# Patient Record
Sex: Female | Born: 1985
Health system: Southern US, Community
[De-identification: ages and names within clinical notes are randomized; demographics above are authoritative.]

## PROBLEM LIST (undated history)

## (undated) ENCOUNTER — Inpatient Hospital Stay (HOSPITAL_COMMUNITY): Payer: Self-pay

## (undated) DIAGNOSIS — F419 Anxiety disorder, unspecified: Secondary | ICD-10-CM

## (undated) DIAGNOSIS — J309 Allergic rhinitis, unspecified: Secondary | ICD-10-CM

## (undated) DIAGNOSIS — T7840XA Allergy, unspecified, initial encounter: Secondary | ICD-10-CM

## (undated) DIAGNOSIS — G43909 Migraine, unspecified, not intractable, without status migrainosus: Secondary | ICD-10-CM

## (undated) HISTORY — DX: Anxiety disorder, unspecified: F41.9

## (undated) HISTORY — PX: LIPOMA EXCISION: SHX5283

## (undated) HISTORY — DX: Migraine, unspecified, not intractable, without status migrainosus: G43.909

## (undated) HISTORY — DX: Allergy, unspecified, initial encounter: T78.40XA

## (undated) HISTORY — DX: Allergic rhinitis, unspecified: J30.9

---

## 2007-02-12 ENCOUNTER — Other Ambulatory Visit: Admission: RE | Admit: 2007-02-12 | Discharge: 2007-02-12 | Payer: Self-pay | Admitting: Obstetrics and Gynecology

## 2008-02-13 ENCOUNTER — Other Ambulatory Visit: Admission: RE | Admit: 2008-02-13 | Discharge: 2008-02-13 | Payer: Self-pay | Admitting: Obstetrics and Gynecology

## 2009-03-04 ENCOUNTER — Other Ambulatory Visit: Admission: RE | Admit: 2009-03-04 | Discharge: 2009-03-04 | Payer: Self-pay | Admitting: Obstetrics and Gynecology

## 2010-05-04 ENCOUNTER — Other Ambulatory Visit
Admission: RE | Admit: 2010-05-04 | Discharge: 2010-05-04 | Payer: Self-pay | Source: Home / Self Care | Admitting: Obstetrics and Gynecology

## 2012-01-09 ENCOUNTER — Other Ambulatory Visit (HOSPITAL_COMMUNITY)
Admission: RE | Admit: 2012-01-09 | Discharge: 2012-01-09 | Disposition: A | Discharge status: Home / Self Care | Payer: Commercial Indemnity | Source: Ambulatory Visit | Attending: Obstetrics and Gynecology | Admitting: Obstetrics and Gynecology

## 2012-01-09 DIAGNOSIS — Z01419 Encounter for gynecological examination (general) (routine) without abnormal findings: Secondary | ICD-10-CM | POA: Insufficient documentation

## 2014-02-24 ENCOUNTER — Other Ambulatory Visit (HOSPITAL_COMMUNITY)
Admission: RE | Admit: 2014-02-24 | Discharge: 2014-02-24 | Disposition: A | Discharge status: Home / Self Care | Payer: BLUE CROSS/BLUE SHIELD | Source: Ambulatory Visit | Attending: Obstetrics and Gynecology | Admitting: Obstetrics and Gynecology

## 2014-02-24 ENCOUNTER — Other Ambulatory Visit: Payer: Self-pay | Admitting: Nurse Practitioner

## 2014-02-24 DIAGNOSIS — Z01419 Encounter for gynecological examination (general) (routine) without abnormal findings: Secondary | ICD-10-CM | POA: Diagnosis not present

## 2014-02-25 LAB — CYTOLOGY - PAP

## 2014-04-13 ENCOUNTER — Other Ambulatory Visit: Payer: Self-pay | Admitting: Obstetrics and Gynecology

## 2014-04-13 DIAGNOSIS — N644 Mastodynia: Secondary | ICD-10-CM

## 2014-04-15 ENCOUNTER — Ambulatory Visit
Admission: RE | Admit: 2014-04-15 | Discharge: 2014-04-15 | Disposition: A | Discharge status: Home / Self Care | Payer: Commercial Indemnity | Source: Ambulatory Visit | Attending: Obstetrics and Gynecology | Admitting: Obstetrics and Gynecology

## 2014-04-15 DIAGNOSIS — N644 Mastodynia: Secondary | ICD-10-CM

## 2015-04-25 NOTE — L&D Delivery Note (Signed)
Delivery Note At 10:22 AM a viable female was delivered via Vaginal, Spontaneous Delivery (Presentation: direct OA;  ).  APGAR: 8, 9; weight pending  .   Placenta status: Intact,Spontaneous .  Cord:  with the following complications: .  Cord pH: n/a. Copious meconium stained fluid noted after baby delivered.  Anesthesia:  Epidural, Less than 10 ml Lidocaine Episiotomy: None Lacerations: 2nd degree-repaired;Perineal;Labial-hemostatic, not repaired Suture Repair: 2.0 3.0 vicryl Est. Blood Loss (mL):  200  Mom to postpartum.  Baby to Couplet care / Skin to Skin.  Diane Cross 03/07/2016, 11:08 AM

## 2015-07-12 ENCOUNTER — Emergency Department (HOSPITAL_BASED_OUTPATIENT_CLINIC_OR_DEPARTMENT_OTHER)
Admission: EM | Admit: 2015-07-12 | Discharge: 2015-07-12 | Disposition: A | Discharge status: Home / Self Care | Payer: BLUE CROSS/BLUE SHIELD | Attending: Emergency Medicine | Admitting: Emergency Medicine

## 2015-07-12 ENCOUNTER — Encounter (HOSPITAL_BASED_OUTPATIENT_CLINIC_OR_DEPARTMENT_OTHER): Payer: Self-pay | Admitting: *Deleted

## 2015-07-12 DIAGNOSIS — J069 Acute upper respiratory infection, unspecified: Secondary | ICD-10-CM | POA: Diagnosis not present

## 2015-07-12 DIAGNOSIS — M791 Myalgia: Secondary | ICD-10-CM | POA: Insufficient documentation

## 2015-07-12 DIAGNOSIS — J029 Acute pharyngitis, unspecified: Secondary | ICD-10-CM | POA: Diagnosis present

## 2015-07-12 DIAGNOSIS — J3489 Other specified disorders of nose and nasal sinuses: Secondary | ICD-10-CM

## 2015-07-12 LAB — RAPID STREP SCREEN (MED CTR MEBANE ONLY): Streptococcus, Group A Screen (Direct): NEGATIVE

## 2015-07-12 MED ORDER — ACETAMINOPHEN 500 MG PO TABS
1000.0000 mg | ORAL_TABLET | Freq: Once | ORAL | Status: AC
  Administered 2015-07-12: 1000 mg via ORAL
  Filled 2015-07-12: qty 2

## 2015-07-12 MED ORDER — OXYMETAZOLINE HCL 0.05 % NA SOLN
2.0000 | Freq: Once | NASAL | Status: AC
  Administered 2015-07-12: 2 via NASAL
  Filled 2015-07-12: qty 15

## 2015-07-12 NOTE — Discharge Instructions (Signed)
Upper Respiratory Infection, Adult Most upper respiratory infections (URIs) are a viral infection of the air passages leading to the lungs. A URI affects the nose, throat, and upper air passages. The most common type of URI is nasopharyngitis and is typically referred to as "the common cold." URIs run their course and usually go away on their own. Most of the time, a URI does not require medical attention, but sometimes a bacterial infection in the upper airways can follow a viral infection. This is called a secondary infection. Sinus and middle ear infections are common types of secondary upper respiratory infections. Bacterial pneumonia can also complicate a URI. A URI can worsen asthma and chronic obstructive pulmonary disease (COPD). Sometimes, these complications can require emergency medical care and may be life threatening.  CAUSES Almost all URIs are caused by viruses. A virus is a type of germ and can spread from one person to another.  RISKS FACTORS You may be at risk for a URI if:   You smoke.   You have chronic heart or lung disease.  You have a weakened defense (immune) system.   You are very young or very old.   You have nasal allergies or asthma.  You work in crowded or poorly ventilated areas.  You work in health care facilities or schools. SIGNS AND SYMPTOMS  Symptoms typically develop 2-3 days after you come in contact with a cold virus. Most viral URIs last 7-10 days. However, viral URIs from the influenza virus (flu virus) can last 14-18 days and are typically more severe. Symptoms may include:   Runny or stuffy (congested) nose.   Sneezing.   Cough.   Sore throat.   Headache.   Fatigue.   Fever.   Loss of appetite.   Pain in your forehead, behind your eyes, and over your cheekbones (sinus pain).  Muscle aches.  DIAGNOSIS  Your health care provider may diagnose a URI by:  Physical exam.  Tests to check that your symptoms are not due  to another condition such as:  Strep throat.  Sinusitis.  Pneumonia.  Asthma. TREATMENT  A URI goes away on its own with time. It cannot be cured with medicines, but medicines may be prescribed or recommended to relieve symptoms. Medicines may help:  Reduce your fever.  Reduce your cough.  Relieve nasal congestion. HOME CARE INSTRUCTIONS   Take medicines only as directed by your health care provider.   Gargle warm saltwater or take cough drops to comfort your throat as directed by your health care provider.  Use a warm mist humidifier or inhale steam from a shower to increase air moisture. This may make it easier to breathe.  Drink enough fluid to keep your urine clear or pale yellow.   Eat soups and other clear broths and maintain good nutrition.   Rest as needed.   Return to work when your temperature has returned to normal or as your health care provider advises. You may need to stay home longer to avoid infecting others. You can also use a face mask and careful hand washing to prevent spread of the virus.  Increase the usage of your inhaler if you have asthma.   Do not use any tobacco products, including cigarettes, chewing tobacco, or electronic cigarettes. If you need help quitting, ask your health care provider. PREVENTION  The best way to protect yourself from getting a cold is to practice good hygiene.   Avoid oral or hand contact with people with cold  symptoms.   Wash your hands often if contact occurs.  There is no clear evidence that vitamin C, vitamin E, echinacea, or exercise reduces the chance of developing a cold. However, it is always recommended to get plenty of rest, exercise, and practice good nutrition.  SEEK MEDICAL CARE IF:   You are getting worse rather than better.   Your symptoms are not controlled by medicine.   You have chills.  You have worsening shortness of breath.  You have brown or red mucus.  You have yellow or brown  nasal discharge.  You have pain in your face, especially when you bend forward.  You have a fever.  You have swollen neck glands.  You have pain while swallowing.  You have white areas in the back of your throat. SEEK IMMEDIATE MEDICAL CARE IF:   You have severe or persistent:  Headache.  Ear pain.  Sinus pain.  Chest pain.  You have chronic lung disease and any of the following:  Wheezing.  Prolonged cough.  Coughing up blood.  A change in your usual mucus.  You have a stiff neck.  You have changes in your:  Vision.  Hearing.  Thinking.  Mood. MAKE SURE YOU:   Understand these instructions.  Will watch your condition.  Will get help right away if you are not doing well or get worse.   This information is not intended to replace advice given to you by your health care provider. Make sure you discuss any questions you have with your health care provider.   Document Released: 10/04/2000 Document Revised: 08/25/2014 Document Reviewed: 07/16/2013 Elsevier Interactive Patient Education 2016 [employer] Inc[employer] Sinus Headache A sinus headache occurs when the paranasal sinuses become clogged or swollen. Paranasal sinuses are air pockets within the bones of the face. Sinus headaches can range from mild to severe. CAUSES A sinus headache can result from various conditions that affect the sinuses, such as:  Colds.  Sinus infections.  Allergies. SYMPTOMS The main symptom of this condition is a headache that may feel like pain or pressure in the face, forehead, ears, or upper teeth. People who have a sinus headache often have other symptoms, such as:  Congested or runny nose.  Fever.  Inability to smell. Weather changes can make symptoms worse. DIAGNOSIS This condition may be diagnosed based on:  A physical exam and medical history.  Imaging tests, such as a CT scan and MRI, to check for problems with the sinuses.  A specialist may look into the  sinuses with a tool that has a camera (endoscopy). TREATMENT Treatment for this condition depends on the cause.  Sinus pain that is caused by a sinus infection may be treated with antibiotic medicine.  Sinus pain that is caused by allergies may be helped by allergy medicines (antihistamines) and medicated nasal sprays.  Sinus pain that is caused by congestion may be helped by flushing the nose and sinuses with saline solution. HOME CARE INSTRUCTIONS  Take medicines only as directed by your health care provider.  If you were prescribed an antibiotic medicine, finish all of it even if you start to feel better.  If you have congestion, use a nasal spray to help reduce pressure.  If directed, apply a warm, moist washcloth to your face to help relieve pain. SEEK MEDICAL CARE IF:  You have headaches more than one time each week.  You have sensitivity to light or sound.  You have a fever.  You feel sick to your  stomach (nauseous) or you throw up (vomit).  Your headaches do not get better with treatment. Many people think that they have a sinus headache when they actually have migraines or tension headaches. SEEK IMMEDIATE MEDICAL CARE IF:  You have vision problems.  You have sudden, severe pain in your face or head.  You have a seizure.  You are confused.  You have a stiff neck.   This information is not intended to replace advice given to you by your health care provider. Make sure you discuss any questions you have with your health care provider.   Document Released: 05/18/2004 Document Revised: 08/25/2014 Document Reviewed: 04/06/2014 Elsevier Interactive Patient Education [employer]

## 2015-07-12 NOTE — ED Notes (Signed)
Pt reports sore throat, congestion, cough x Friday with subjective temps.

## 2015-07-12 NOTE — ED Provider Notes (Signed)
CSN: [id number]     Arrival date & time 07/12/15  [id number] History   First MD Initiated Contact with Patient 07/12/15 [phone]     Chief Complaint  Patient presents with  . Sore Throat     (Consider location/radiation/quality/duration/timing/severity/associated sxs/prior Treatment) HPI Patient presents with sinus congestion, nasal congestion, nonproductive cough and sore throat for the past 3 days. She also has had diffuse myalgias. States she is [redacted] weeks pregnant. Denies any vaginal discharge or bleeding. No known sick contacts. She's been taking over-the-counter Claritin and nasal spray with little relief. Also taking Tylenol for pain. Last Tylenol roughly 6 hours ago. History reviewed. No pertinent past medical history. History reviewed. No pertinent past surgical history. History reviewed. No pertinent family history. Social History  Substance Use Topics  . Smoking status: Never Smoker   . Smokeless tobacco: None  . Alcohol Use: None   OB History    Gravida Para Term Preterm AB TAB SAB Ectopic Multiple Living   1              Review of Systems  Constitutional: Positive for fever. Negative for chills.  HENT: Positive for congestion, sinus pressure and sore throat. Negative for rhinorrhea.   Respiratory: Positive for cough. Negative for shortness of breath and wheezing.   Cardiovascular: Negative for chest pain.  Gastrointestinal: Negative for nausea, vomiting, abdominal pain and diarrhea.  Genitourinary: Negative for flank pain, vaginal bleeding, vaginal discharge and pelvic pain.  Musculoskeletal: Positive for myalgias. Negative for back pain, neck pain and neck stiffness.  Skin: Negative for rash and wound.  Neurological: Positive for headaches. Negative for dizziness, weakness, light-headedness and numbness.  All other systems reviewed and are negative.     Allergies  Review of patient's allergies indicates no known allergies.  Home Medications   Prior to Admission  medications   Not on File   BP 118/82 mmHg  Pulse 94  Temp(Src) 98.6 F (37 C) (Oral)  Resp 16  Ht 5\' 1"  (1.549 m)  Wt 140 lb (63.504 kg)  BMI 26.47 kg/m2  SpO2 100% Physical Exam  Constitutional: She is oriented to person, place, and time. She appears well-developed and well-nourished. No distress.  HENT:  Head: Normocephalic and atraumatic.  Mouth/Throat: Oropharynx is clear and moist.  Both nasal mucosal edema. Patient has full bilateral TMs without erythema. Oropharynx is mildly erythematous but no tonsillar exudates and uvula is midline.  Eyes: EOM are normal. Pupils are equal, round, and reactive to light.  Neck: Normal range of motion. Neck supple.  No meningismus  Cardiovascular: Normal rate and regular rhythm.  Exam reveals no gallop and no friction rub.   No murmur heard. Pulmonary/Chest: Effort normal and breath sounds normal. No respiratory distress. She has no wheezes. She has no rales. She exhibits no tenderness.  Abdominal: Soft. Bowel sounds are normal. She exhibits no distension and no mass. There is no tenderness. There is no rebound and no guarding.  Musculoskeletal: Normal range of motion. She exhibits no edema or tenderness.  The CVA tenderness bilaterally. No lower extremity asymmetry or tenderness.  Lymphadenopathy:    She has cervical adenopathy.  Neurological: She is alert and oriented to person, place, and time.  Skin: Skin is warm and dry. No rash noted. No erythema.  Psychiatric: She has a normal mood and affect. Her behavior is normal.  Nursing note and vitals reviewed.   ED Course  Procedures (including critical care time) Labs Review Labs Reviewed  RAPID STREP SCREEN (  NOT AT [facility])  CULTURE, GROUP A STREP [facility])    Imaging Review No results found. I have personally reviewed and evaluated these images and lab results as part of my medical decision-making.   EKG Interpretation None      MDM   Final diagnoses:  Sinus pressure  URI  (upper respiratory infection)    Patient is well-appearing. Suspect URI versus flu like symptoms. Advised to continue Claritin, Afrin and Tylenol at home. Return precautions given.    Loren Racer, MD 07/12/15 [phone]

## 2015-07-14 LAB — CULTURE, GROUP A STREP (THRC)

## 2015-07-22 LAB — OB RESULTS CONSOLE HEPATITIS B SURFACE ANTIGEN: HEP B S AG: NEGATIVE

## 2015-07-22 LAB — OB RESULTS CONSOLE ABO/RH: RH TYPE: POSITIVE

## 2015-07-22 LAB — OB RESULTS CONSOLE RUBELLA ANTIBODY, IGM: RUBELLA: IMMUNE

## 2015-07-22 LAB — OB RESULTS CONSOLE GC/CHLAMYDIA
CHLAMYDIA, DNA PROBE: NEGATIVE
Gonorrhea: NEGATIVE

## 2015-07-22 LAB — OB RESULTS CONSOLE ANTIBODY SCREEN: Antibody Screen: NEGATIVE

## 2015-07-22 LAB — OB RESULTS CONSOLE RPR: RPR: NONREACTIVE

## 2015-07-22 LAB — OB RESULTS CONSOLE HIV ANTIBODY (ROUTINE TESTING): HIV: NONREACTIVE

## 2015-07-29 DIAGNOSIS — Z3401 Encounter for supervision of normal first pregnancy, first trimester: Secondary | ICD-10-CM | POA: Diagnosis not present

## 2015-08-11 DIAGNOSIS — Z3A10 10 weeks gestation of pregnancy: Secondary | ICD-10-CM | POA: Diagnosis not present

## 2015-08-11 DIAGNOSIS — Z3401 Encounter for supervision of normal first pregnancy, first trimester: Secondary | ICD-10-CM | POA: Diagnosis not present

## 2015-09-07 DIAGNOSIS — J301 Allergic rhinitis due to pollen: Secondary | ICD-10-CM | POA: Diagnosis not present

## 2015-09-22 DIAGNOSIS — Z3401 Encounter for supervision of normal first pregnancy, first trimester: Secondary | ICD-10-CM | POA: Diagnosis not present

## 2015-10-11 DIAGNOSIS — Z3402 Encounter for supervision of normal first pregnancy, second trimester: Secondary | ICD-10-CM | POA: Diagnosis not present

## 2015-10-11 DIAGNOSIS — Z36 Encounter for antenatal screening of mother: Secondary | ICD-10-CM | POA: Diagnosis not present

## 2015-10-11 DIAGNOSIS — Z3A19 19 weeks gestation of pregnancy: Secondary | ICD-10-CM | POA: Diagnosis not present

## 2015-12-02 DIAGNOSIS — Z3402 Encounter for supervision of normal first pregnancy, second trimester: Secondary | ICD-10-CM | POA: Diagnosis not present

## 2015-12-21 DIAGNOSIS — Z23 Encounter for immunization: Secondary | ICD-10-CM | POA: Diagnosis not present

## 2016-01-17 DIAGNOSIS — Z23 Encounter for immunization: Secondary | ICD-10-CM | POA: Diagnosis not present

## 2016-01-18 ENCOUNTER — Inpatient Hospital Stay (HOSPITAL_COMMUNITY)
Admission: AD | Admit: 2016-01-18 | Discharge: 2016-01-19 | Disposition: A | Discharge status: Home / Self Care | Payer: BLUE CROSS/BLUE SHIELD | Source: Ambulatory Visit | Attending: Obstetrics & Gynecology | Admitting: Obstetrics & Gynecology

## 2016-01-18 ENCOUNTER — Encounter (HOSPITAL_COMMUNITY): Payer: Self-pay | Admitting: *Deleted

## 2016-01-18 DIAGNOSIS — O368130 Decreased fetal movements, third trimester, not applicable or unspecified: Secondary | ICD-10-CM | POA: Insufficient documentation

## 2016-01-18 DIAGNOSIS — Z3A33 33 weeks gestation of pregnancy: Secondary | ICD-10-CM | POA: Insufficient documentation

## 2016-01-18 DIAGNOSIS — Z3689 Encounter for other specified antenatal screening: Secondary | ICD-10-CM

## 2016-01-18 DIAGNOSIS — O368190 Decreased fetal movements, unspecified trimester, not applicable or unspecified: Secondary | ICD-10-CM

## 2016-01-18 NOTE — MAU Note (Signed)
Pt reports decreased fetal movement today, felt a kick in waiting room.

## 2016-01-18 NOTE — MAU Note (Signed)
PT SAYS  SHE HAS NOT  BEEN  MOVING  A LOT  TODAY     -   BUT  FELT  LAST MOVEMENT    IN LOBBY   .

## 2016-01-19 ENCOUNTER — Encounter (HOSPITAL_COMMUNITY): Payer: Self-pay | Admitting: Advanced Practice Midwife

## 2016-01-19 DIAGNOSIS — O368190 Decreased fetal movements, unspecified trimester, not applicable or unspecified: Secondary | ICD-10-CM | POA: Diagnosis not present

## 2016-01-19 DIAGNOSIS — O368130 Decreased fetal movements, third trimester, not applicable or unspecified: Secondary | ICD-10-CM | POA: Diagnosis not present

## 2016-01-19 DIAGNOSIS — Z3A33 33 weeks gestation of pregnancy: Secondary | ICD-10-CM | POA: Diagnosis not present

## 2016-01-19 NOTE — Discharge Instructions (Signed)

## 2016-01-19 NOTE — MAU Provider Note (Signed)
Chief Complaint:  No chief complaint on file.   First Provider Initiated Contact with Patient 01/18/16 2350     HPI  Diane Cross is a 30 y.o. G1P0 at 69w4dwho presents to maternity admissions reporting decreased fetal movement today.  Has felt some movement since coming here tonight. She denies LOF, vaginal bleeding, vaginal itching/burning, urinary symptoms, h/a, dizziness, n/v, diarrhea, constipation or fever/chills.  She denies headache, visual changes or RUQ abdominal pain.  Has been followed in office by Dr Charlotta Newton and pregnancy has been unremarkable  RN Note: PT SAYS  SHE HAS NOT  BEEN  MOVING  A LOT  TODAY     -   BUT  FELT  LAST MOVEMENT    IN LOBBY     Past Medical History: History reviewed. No pertinent past medical history.  Past obstetric history: OB History  Gravida Para Term Preterm AB Living  1            SAB TAB Ectopic Multiple Live Births               # Outcome Date GA Lbr Len/2nd Weight Sex Delivery Anes PTL Lv  1 Current               Past Surgical History: History reviewed. No pertinent surgical history.  Family History: History reviewed. No pertinent family history.  Social History: Social History  Substance Use Topics  . Smoking status: Never Smoker  . Smokeless tobacco: Never Used  . Alcohol use No    Allergies: No Known Allergies  Meds:  Prescriptions Prior to Admission  Medication Sig Dispense Refill Last Dose  . Prenatal Vit-Fe Fumarate-FA (PRENATAL MULTIVITAMIN) TABS tablet Take 1 tablet by mouth daily at 12 noon.     . ranitidine (ZANTAC) 150 MG tablet Take 150 mg by mouth 2 (two) times daily.   01/18/2016 at Unknown time    I have reviewed patient's Past Medical Hx, Surgical Hx, Family Hx, Social Hx, medications and allergies.   ROS:  Review of Systems  Constitutional: Negative for chills and fever.  Respiratory: Negative for shortness of breath.   Gastrointestinal: Negative for abdominal pain, constipation, diarrhea, nausea and  vomiting.  Genitourinary: Negative for pelvic pain, vaginal bleeding and vaginal discharge.       Decreased fetal movement  Musculoskeletal: Negative for back pain.   Other systems negative  Physical Exam  Patient Vitals for the past 24 hrs:  BP Temp Temp src Pulse Resp SpO2 Height Weight  01/18/16 2340 127/73 97.4 F (36.3 C) Oral 84 18 100 % 5\' 1"  (1.549 m) 184 lb (83.5 kg)   Constitutional: Well-developed, well-nourished female in no acute distress.  Cardiovascular: normal rate and rhythm Respiratory: normal effort, clear to auscultation bilaterally GI: Abd soft, non-tender, gravid appropriate for gestational age.   No rebound or guarding. MS: Extremities nontender, no edema, normal ROM Neurologic: Alert and oriented x 4.  GU: Neg CVAT.  PELVIC EXAM: deferred    FHT:  Baseline 135 , moderate variability, accelerations present, no decelerations Contractions: Rare   Labs: No results found for this or any previous visit (from the past 24 hour(s)).  Imaging:  No results found.  MAU Course/MDM: NST reviewed Consult Dr Charlotta Newton with presentation, exam findings and test results.  Treatments in MAU included NST.    Assessment: SIUP at [redacted]w[redacted]d Decreased fetal movement today Reactive nonstress test, Category I tracing  Plan: Discharge home Preterm Labor precautions and fetal kick counts Follow up in  Office for prenatal visits and recheck of fetal status  Encouraged to return here or to other Urgent Care/ED if she develops worsening of symptoms, increase in pain, fever, or other concerning symptoms.       Pt stable at time of discharge.  Wynelle BourgeoisMarie Martez Weiand CNM, MSN Certified Nurse-Midwife 01/19/2016 12:42 AM

## 2016-02-07 DIAGNOSIS — Z3403 Encounter for supervision of normal first pregnancy, third trimester: Secondary | ICD-10-CM | POA: Diagnosis not present

## 2016-03-02 ENCOUNTER — Encounter (HOSPITAL_COMMUNITY): Payer: Self-pay | Admitting: *Deleted

## 2016-03-02 ENCOUNTER — Telehealth (HOSPITAL_COMMUNITY): Payer: Self-pay | Admitting: *Deleted

## 2016-03-02 NOTE — Telephone Encounter (Signed)
Preadmission screen  

## 2016-03-03 DIAGNOSIS — O368390 Maternal care for abnormalities of the fetal heart rate or rhythm, unspecified trimester, not applicable or unspecified: Secondary | ICD-10-CM | POA: Diagnosis not present

## 2016-03-06 ENCOUNTER — Telehealth: Payer: Self-pay

## 2016-03-06 ENCOUNTER — Encounter (HOSPITAL_COMMUNITY): Payer: Self-pay | Admitting: *Deleted

## 2016-03-06 ENCOUNTER — Inpatient Hospital Stay (HOSPITAL_COMMUNITY)
Admission: AD | Admit: 2016-03-06 | Discharge: 2016-03-06 | Disposition: A | Discharge status: Home / Self Care | Payer: BLUE CROSS/BLUE SHIELD | Source: Ambulatory Visit | Attending: Obstetrics & Gynecology | Admitting: Obstetrics & Gynecology

## 2016-03-06 DIAGNOSIS — Z3A40 40 weeks gestation of pregnancy: Secondary | ICD-10-CM | POA: Insufficient documentation

## 2016-03-06 DIAGNOSIS — O471 False labor at or after 37 completed weeks of gestation: Secondary | ICD-10-CM

## 2016-03-06 MED ORDER — LACTATED RINGERS IV SOLN
INTRAVENOUS | Status: DC
  Administered 2016-03-06: 13:00:00 via INTRAVENOUS

## 2016-03-06 MED ORDER — BUTORPHANOL TARTRATE 1 MG/ML IJ SOLN
1.0000 mg | Freq: Once | INTRAMUSCULAR | Status: AC
  Administered 2016-03-06: 1 mg via INTRAVENOUS
  Filled 2016-03-06: qty 1

## 2016-03-06 MED ORDER — OXYCODONE-ACETAMINOPHEN 5-325 MG PO TABS: 1.0000 | ORAL_TABLET | ORAL | 0 refills | Status: DC | PRN

## 2016-03-06 NOTE — Progress Notes (Signed)
HPI: 30 y/o G1P0 @ 8351w2d estimated gestational age (as dated by LMP c/w 20 week ultrasound) presents complaining of painful contractions.  Patient was seen this am for her OB visit and noted contractions q 4min, but not that painful.  Now, she feels like pain has gotten much worse..   no Leaking of Fluid,   +bloody show   + Fetal Movement.  ROS: n HA, ono epigastric pain, no visual changes.    Pregnancy uncomplicated   Prenatal Transfer Tool  Maternal Diabetes: No Genetic Screening: Normal Maternal Ultrasounds/Referrals: Normal Fetal Ultrasounds or other Referrals:  None Maternal Substance Abuse:  No Significant Maternal Medications:  None Significant Maternal Lab Results: Lab values include: Group B Strep negative   PNL:  GBS negative, Rub Immune, Hep B neg, RPR NR, HIV neg, GC/C neg, glucola:normal Hgb: 12.1 Blood type: O positive, antibody negative  Immunizations: Tdap: 8/29 Flu: 9/25  OBHx: primip PMHx:  none Meds:  PNV, tylenol prn Allergy:  No Known Allergies SurgHx: none SocHx:   no Tobacco, no  EtOH, no Illicit Drugs  O: BP 117/72   Pulse 85   Temp 98.8 F (37.1 C) (Oral)   Resp 20   Ht 5\' 1"  (1.549 m)   Wt 188 lb (85.3 kg)   BMI 35.52 kg/m    Gen. AAOx3, NAD Resp. CTAB, no wheeze or crackles. Abd. Gravid,  no tenderness,  no rigidity,  no guarding Extr.  1+ non-pitting edema, no calf tenderness bilaterally FHT: 140 baseline, moderate variability, + accels,  no decels Toco: irregular SVE: 2-3/50/-3 @ 1300, repeat by RN @ 1730 unchanged   Labs: see orders  A/P:  30 y.o. G1P0 @ 3751w2d EGA who presents for latent labor -FWB:  NICHD Cat I FHTs -Labor: no cervical dilation noted and contractions spaced with IV fluids.  Discussed IOL vs close monitoring.  Reviewed risk/benefit of each, pt wishes to go home.  Reviewed LRP and fetal kick counts, pt to f/u as needed -GBS: negative  Myna HidalgoJennifer Maruice Pieroni, DO [phone] (pager) [phone] (office)

## 2016-03-06 NOTE — MAU Note (Signed)
Pt states she was cramping during the night, began having stronger uc's this a.m.  Was seen @ MD office, membranes were swept, SVE 1-2 cm's @ 0920.  Has had small amount of bleeding, denies LOF.

## 2016-03-06 NOTE — Discharge Instructions (Signed)
Braxton Hicks Contractions °Contractions of the uterus can occur throughout pregnancy. Contractions are not always a sign that you are in labor.  °WHAT ARE BRAXTON HICKS CONTRACTIONS?  °Contractions that occur before labor are called Braxton Hicks contractions, or false labor. Toward the end of pregnancy (32-34 weeks), these contractions can develop more often and may become more forceful. This is not true labor because these contractions do not result in opening (dilatation) and thinning of the cervix. They are sometimes difficult to tell apart from true labor because these contractions can be forceful and people have different pain tolerances. You should not feel embarrassed if you go to the hospital with false labor. Sometimes, the only way to tell if you are in true labor is for your health care provider to look for changes in the cervix. °If there are no prenatal problems or other health problems associated with the pregnancy, it is completely safe to be sent home with false labor and await the onset of true labor. °HOW CAN YOU TELL THE DIFFERENCE BETWEEN TRUE AND FALSE LABOR? °False Labor °· The contractions of false labor are usually shorter and not as hard as those of true labor.   °· The contractions are usually irregular.   °· The contractions are often felt in the front of the lower abdomen and in the groin.   °· The contractions may go away when you walk around or change positions while lying down.   °· The contractions get weaker and are shorter lasting as time goes on.   °· The contractions do not usually become progressively stronger, regular, and closer together as with true labor.   °True Labor °· Contractions in true labor last 30-70 seconds, become very regular, usually become more intense, and increase in frequency.   °· The contractions do not go away with walking.   °· The discomfort is usually felt in the top of the uterus and spreads to the lower abdomen and low back.   °· True labor can be  determined by your health care provider with an exam. This will show that the cervix is dilating and getting thinner.   °WHAT TO REMEMBER °· Keep up with your usual exercises and follow other instructions given by your health care provider.   °· Take medicines as directed by your health care provider.   °· Keep your regular prenatal appointments.   °· Eat and drink lightly if you think you are going into labor.   °· If Braxton Hicks contractions are making you uncomfortable:   °¨ Change your position from lying down or resting to walking, or from walking to resting.   °¨ Sit and rest in a tub of warm water.   °¨ Drink 2-3 glasses of water. Dehydration may cause these contractions.   °¨ Do slow and deep breathing several times an hour.   °WHEN SHOULD I SEEK IMMEDIATE MEDICAL CARE? °Seek immediate medical care if: °· Your contractions become stronger, more regular, and closer together.   °· You have fluid leaking or gushing from your vagina.   °· You have a fever.   °· You pass blood-tinged mucus.   °· You have vaginal bleeding.   °· You have continuous abdominal pain.   °· You have low back pain that you never had before.   °· You feel your baby's head pushing down and causing pelvic pressure.   °· Your baby is not moving as much as it used to.   °  °This information is not intended to replace advice given to you by your health care provider. Make sure you discuss any questions you have with your health care   provider. °  °Document Released: 04/10/2005 Document Revised: 04/15/2013 Document Reviewed: 01/20/2013 °Elsevier Interactive Patient Education ©2016 [employer] ° °

## 2016-03-06 NOTE — Telephone Encounter (Signed)
Patient call reports cramping since 11am that is increasing in frequency and intensity.  Patient reports active fetus and denies Lof and VB.  Patient states cramping is intermittent and is causing difficulty in sleeping.  Reassurances given.  Educated regarding early labor.  Given coping techniques.  Instructed to keep appt in am at 0915, but to report to hospital if unable to cope or if symptoms worsen.  No other q/c.  JE, CNM

## 2016-03-07 ENCOUNTER — Encounter (HOSPITAL_COMMUNITY): Payer: Self-pay

## 2016-03-07 ENCOUNTER — Inpatient Hospital Stay (HOSPITAL_COMMUNITY): Payer: BLUE CROSS/BLUE SHIELD | Admitting: Anesthesiology

## 2016-03-07 ENCOUNTER — Inpatient Hospital Stay (HOSPITAL_COMMUNITY)
Admission: AD | Admit: 2016-03-07 | Discharge: 2016-03-09 | DRG: 775 | Disposition: A | Discharge status: Home / Self Care | Payer: BLUE CROSS/BLUE SHIELD | Source: Ambulatory Visit | Attending: Obstetrics & Gynecology | Admitting: Obstetrics & Gynecology

## 2016-03-07 DIAGNOSIS — Z3A40 40 weeks gestation of pregnancy: Secondary | ICD-10-CM

## 2016-03-07 DIAGNOSIS — O4202 Full-term premature rupture of membranes, onset of labor within 24 hours of rupture: Secondary | ICD-10-CM | POA: Diagnosis not present

## 2016-03-07 DIAGNOSIS — Z3A00 Weeks of gestation of pregnancy not specified: Secondary | ICD-10-CM | POA: Diagnosis not present

## 2016-03-07 DIAGNOSIS — Z3A41 41 weeks gestation of pregnancy: Secondary | ICD-10-CM | POA: Diagnosis not present

## 2016-03-07 LAB — COMPREHENSIVE METABOLIC PANEL
ALBUMIN: 3.1 g/dL — AB (ref 3.5–5.0)
ALT: 14 U/L (ref 14–54)
AST: 29 U/L (ref 15–41)
Alkaline Phosphatase: 129 U/L — ABNORMAL HIGH (ref 38–126)
Anion gap: 14 (ref 5–15)
BUN: 9 mg/dL (ref 6–20)
CHLORIDE: 103 mmol/L (ref 101–111)
CO2: 15 mmol/L — AB (ref 22–32)
CREATININE: 0.82 mg/dL (ref 0.44–1.00)
Calcium: 8.8 mg/dL — ABNORMAL LOW (ref 8.9–10.3)
GFR calc Af Amer: >60 mL/min (ref >60)
GFR calc non Af Amer: >60 mL/min (ref >60)
GLUCOSE: 113 mg/dL — AB (ref 65–99)
Potassium: 3.9 mmol/L (ref 3.5–5.1)
SODIUM: 132 mmol/L — AB (ref 135–145)
Total Bilirubin: 0.5 mg/dL (ref 0.3–1.2)
Total Protein: 6.6 g/dL (ref 6.5–8.1)

## 2016-03-07 LAB — CBC
HCT: 36.6 % (ref 36.0–46.0)
HEMATOCRIT: 35.7 % — AB (ref 36.0–46.0)
HEMOGLOBIN: 12.9 g/dL (ref 12.0–15.0)
Hemoglobin: 12.9 g/dL (ref 12.0–15.0)
MCH: 29.9 pg (ref 26.0–34.0)
MCH: 29.7 pg (ref 26.0–34.0)
MCHC: 36.1 g/dL — ABNORMAL HIGH (ref 30.0–36.0)
MCHC: 35.2 g/dL (ref 30.0–36.0)
MCV: 82.6 fL (ref 78.0–100.0)
MCV: 84.3 fL (ref 78.0–100.0)
PLATELETS: 275 10*3/uL (ref 150–400)
PLATELETS: 298 10*3/uL (ref 150–400)
RBC: 4.32 MIL/uL (ref 3.87–5.11)
RBC: 4.34 MIL/uL (ref 3.87–5.11)
RDW: 14.1 % (ref 11.5–15.5)
RDW: 14.4 % (ref 11.5–15.5)
WBC: 22.3 10*3/uL — AB (ref 4.0–10.5)
WBC: 29.1 10*3/uL — AB (ref 4.0–10.5)

## 2016-03-07 LAB — ABO/RH: ABO/RH(D): O POS

## 2016-03-07 LAB — TYPE AND SCREEN
ABO/RH(D): O POS
Antibody Screen: NEGATIVE

## 2016-03-07 LAB — POCT FERN TEST: POCT Fern Test: POSITIVE

## 2016-03-07 LAB — PROTEIN / CREATININE RATIO, URINE
Creatinine, Urine: 239 mg/dL
Protein Creatinine Ratio: 0.49 mg/mg{Cre} — ABNORMAL HIGH (ref 0.00–0.15)
Total Protein, Urine: 116 mg/dL

## 2016-03-07 LAB — RPR: RPR Ser Ql: NONREACTIVE

## 2016-03-07 MED ORDER — SENNOSIDES-DOCUSATE SODIUM 8.6-50 MG PO TABS
2.0000 | ORAL_TABLET | ORAL | Status: DC
  Administered 2016-03-07 – 2016-03-09 (×2): 2 via ORAL
  Filled 2016-03-07 (×2): qty 2

## 2016-03-07 MED ORDER — OXYTOCIN 40 UNITS IN LACTATED RINGERS INFUSION - SIMPLE MED: 2.5000 [IU]/h | INTRAVENOUS | Status: DC | PRN

## 2016-03-07 MED ORDER — LIDOCAINE HCL (PF) 1 % IJ SOLN
30.0000 mL | INTRAMUSCULAR | Status: AC | PRN
  Administered 2016-03-07: 30 mL via SUBCUTANEOUS
  Filled 2016-03-07: qty 30

## 2016-03-07 MED ORDER — MISOPROSTOL 200 MCG PO TABS: 800.0000 ug | ORAL_TABLET | Freq: Once | ORAL | Status: DC | PRN

## 2016-03-07 MED ORDER — TETANUS-DIPHTH-ACELL PERTUSSIS 5-2.5-18.5 LF-MCG/0.5 IM SUSP: 0.5000 mL | Freq: Once | INTRAMUSCULAR | Status: DC

## 2016-03-07 MED ORDER — LIDOCAINE HCL (PF) 1 % IJ SOLN
INTRAMUSCULAR | Status: DC | PRN
  Administered 2016-03-07 (×2): 4 mL

## 2016-03-07 MED ORDER — SOD CITRATE-CITRIC ACID 500-334 MG/5ML PO SOLN: 30.0000 mL | ORAL | Status: DC | PRN

## 2016-03-07 MED ORDER — WITCH HAZEL-GLYCERIN EX PADS: 1.0000 "application " | MEDICATED_PAD | CUTANEOUS | Status: DC | PRN

## 2016-03-07 MED ORDER — FENTANYL 2.5 MCG/ML BUPIVACAINE 1/10 % EPIDURAL INFUSION (WH - ANES)
14.0000 mL/h | INTRAMUSCULAR | Status: DC | PRN
  Administered 2016-03-07 (×3): 14 mL/h via EPIDURAL
  Filled 2016-03-07 (×2): qty 100

## 2016-03-07 MED ORDER — COCONUT OIL OIL: 1.0000 "application " | TOPICAL_OIL | Status: DC | PRN

## 2016-03-07 MED ORDER — SIMETHICONE 80 MG PO CHEW: 80.0000 mg | CHEWABLE_TABLET | ORAL | Status: DC | PRN

## 2016-03-07 MED ORDER — EPHEDRINE 5 MG/ML INJ
10.0000 mg | INTRAVENOUS | Status: DC | PRN
  Filled 2016-03-07: qty 4

## 2016-03-07 MED ORDER — IBUPROFEN 600 MG PO TABS
600.0000 mg | ORAL_TABLET | Freq: Four times a day (QID) | ORAL | Status: DC
  Administered 2016-03-07 – 2016-03-09 (×8): 600 mg via ORAL
  Filled 2016-03-07 (×9): qty 1

## 2016-03-07 MED ORDER — ONDANSETRON HCL 4 MG/2ML IJ SOLN
4.0000 mg | INTRAMUSCULAR | Status: DC | PRN
Start: 2016-03-07 — End: 2016-03-09

## 2016-03-07 MED ORDER — FENTANYL 2.5 MCG/ML BUPIVACAINE 1/10 % EPIDURAL INFUSION (WH - ANES): 14.0000 mL/h | INTRAMUSCULAR | Status: DC | PRN

## 2016-03-07 MED ORDER — OXYCODONE-ACETAMINOPHEN 5-325 MG PO TABS: 2.0000 | ORAL_TABLET | ORAL | Status: DC | PRN

## 2016-03-07 MED ORDER — OXYTOCIN 40 UNITS IN LACTATED RINGERS INFUSION - SIMPLE MED
1.0000 m[IU]/min | INTRAVENOUS | Status: DC
  Administered 2016-03-07: 2 m[IU]/min via INTRAVENOUS

## 2016-03-07 MED ORDER — OXYTOCIN 40 UNITS IN LACTATED RINGERS INFUSION - SIMPLE MED
2.5000 [IU]/h | INTRAVENOUS | Status: DC
Start: 2016-03-07 — End: 2016-03-07
  Filled 2016-03-07: qty 1000

## 2016-03-07 MED ORDER — OXYCODONE-ACETAMINOPHEN 5-325 MG PO TABS: 1.0000 | ORAL_TABLET | ORAL | Status: DC | PRN

## 2016-03-07 MED ORDER — DIBUCAINE 1 % RE OINT: 1.0000 "application " | TOPICAL_OINTMENT | RECTAL | Status: DC | PRN

## 2016-03-07 MED ORDER — FLEET ENEMA 7-19 GM/118ML RE ENEM: 1.0000 | ENEMA | RECTAL | Status: DC | PRN

## 2016-03-07 MED ORDER — ACETAMINOPHEN 325 MG PO TABS: 650.0000 mg | ORAL_TABLET | ORAL | Status: DC | PRN

## 2016-03-07 MED ORDER — FAMOTIDINE 20 MG PO TABS
20.0000 mg | ORAL_TABLET | Freq: Two times a day (BID) | ORAL | Status: DC | PRN
Start: 2016-03-07 — End: 2016-03-09

## 2016-03-07 MED ORDER — ONDANSETRON HCL 4 MG/2ML IJ SOLN: 4.0000 mg | Freq: Four times a day (QID) | INTRAMUSCULAR | Status: DC | PRN

## 2016-03-07 MED ORDER — OXYTOCIN BOLUS FROM INFUSION
500.0000 mL | Freq: Once | INTRAVENOUS | Status: AC
  Administered 2016-03-07: 500 mL via INTRAVENOUS

## 2016-03-07 MED ORDER — PHENYLEPHRINE 40 MCG/ML (10ML) SYRINGE FOR IV PUSH (FOR BLOOD PRESSURE SUPPORT)
80.0000 ug | PREFILLED_SYRINGE | INTRAVENOUS | Status: DC | PRN
  Filled 2016-03-07: qty 10
  Filled 2016-03-07: qty 5

## 2016-03-07 MED ORDER — BENZOCAINE-MENTHOL 20-0.5 % EX AERO
1.0000 "application " | INHALATION_SPRAY | CUTANEOUS | Status: DC | PRN
  Administered 2016-03-07: 1 via TOPICAL
  Filled 2016-03-07: qty 56

## 2016-03-07 MED ORDER — MAGNESIUM HYDROXIDE 400 MG/5ML PO SUSP: 30.0000 mL | ORAL | Status: DC | PRN

## 2016-03-07 MED ORDER — DIPHENHYDRAMINE HCL 50 MG/ML IJ SOLN: 12.5000 mg | INTRAMUSCULAR | Status: DC | PRN

## 2016-03-07 MED ORDER — TERBUTALINE SULFATE 1 MG/ML IJ SOLN
0.2500 mg | Freq: Once | INTRAMUSCULAR | Status: DC | PRN
  Filled 2016-03-07: qty 1

## 2016-03-07 MED ORDER — DIPHENHYDRAMINE HCL 25 MG PO CAPS: 25.0000 mg | ORAL_CAPSULE | Freq: Four times a day (QID) | ORAL | Status: DC | PRN

## 2016-03-07 MED ORDER — LACTATED RINGERS IV SOLN: 500.0000 mL | INTRAVENOUS | Status: DC | PRN

## 2016-03-07 MED ORDER — ACETAMINOPHEN 325 MG PO TABS
650.0000 mg | ORAL_TABLET | ORAL | Status: DC | PRN
  Administered 2016-03-08 – 2016-03-09 (×4): 650 mg via ORAL
  Filled 2016-03-07 (×4): qty 2

## 2016-03-07 MED ORDER — ZOLPIDEM TARTRATE 5 MG PO TABS: 5.0000 mg | ORAL_TABLET | Freq: Every evening | ORAL | Status: DC | PRN

## 2016-03-07 MED ORDER — PRENATAL MULTIVITAMIN CH
1.0000 | ORAL_TABLET | Freq: Every day | ORAL | Status: DC
  Administered 2016-03-08: 1 via ORAL
  Filled 2016-03-07: qty 1

## 2016-03-07 MED ORDER — LACTATED RINGERS IV SOLN
INTRAVENOUS | Status: DC
  Administered 2016-03-07 (×2): via INTRAVENOUS

## 2016-03-07 MED ORDER — LACTATED RINGERS IV SOLN: 500.0000 mL | Freq: Once | INTRAVENOUS | Status: DC

## 2016-03-07 MED ORDER — MEASLES, MUMPS & RUBELLA VAC ~~LOC~~ INJ
0.5000 mL | INJECTION | Freq: Once | SUBCUTANEOUS | Status: DC
  Filled 2016-03-07: qty 0.5

## 2016-03-07 MED ORDER — ONDANSETRON HCL 4 MG PO TABS: 4.0000 mg | ORAL_TABLET | ORAL | Status: DC | PRN

## 2016-03-07 MED ORDER — PHENYLEPHRINE 40 MCG/ML (10ML) SYRINGE FOR IV PUSH (FOR BLOOD PRESSURE SUPPORT)
80.0000 ug | PREFILLED_SYRINGE | INTRAVENOUS | Status: DC | PRN
  Filled 2016-03-07: qty 5

## 2016-03-07 NOTE — Progress Notes (Signed)
Report called; order received for admission; epidural PRN

## 2016-03-07 NOTE — H&P (Signed)
HPI: 30 y/o G1P0 @ 2189w3d estimated gestational age (as dated by LMP c/w 20 week ultrasound) presents complaining of worsening contractions and now with rupture of membranes around 0100.  Contractions increased and continued blood show.  +FM  ROS: n HA, ono epigastric pain, no visual changes.    Pregnancy uncomplicated   Prenatal Transfer Tool  Maternal Diabetes: No Genetic Screening: Normal Maternal Ultrasounds/Referrals: Normal Fetal Ultrasounds or other Referrals:  None Maternal Substance Abuse:  No Significant Maternal Medications:  None Significant Maternal Lab Results: Lab values include: Group B Strep negative   PNL:  GBS negative, Rub Immune, Hep B neg, RPR NR, HIV neg, GC/C neg, glucola:normal Hgb: 12.1 Blood type: O positive, antibody negative  Immunizations: Tdap: 8/29 Flu: 9/25  OBHx: primip PMHx:  none Meds:  PNV, tylenol prn Allergy:  No Known Allergies SurgHx: none SocHx:   no Tobacco, no  EtOH, no Illicit Drugs  O: BP 109/73   Pulse 92   Temp 97.8 F (36.6 C) (Oral)   Resp 18   Ht 5\' 1"  (1.549 m)   Wt 188 lb (85.3 kg)   SpO2 93%   BMI 35.52 kg/m    Gen. AAOx3, NAD CV: RRR Resp. CTAB, no wheeze or crackles. Abd. Gravid,  no tenderness,  no rigidity,  no guarding Extr.  1+ non-pitting edema, no calf tenderness bilaterally FHT: 150 baseline, moderate variability, + accels,  no decels Toco: q2-324min SVE: HPI: 30 y/o G1P0 @ 7332w2d estimated gestational age (as dated by LMP c/w 20 week ultrasound) presents complaining of painful contractions.  Patient was seen this am for her OB visit and noted contractions q 4min, but not that painful.  Now, she feels like pain has gotten much worse..   no Leaking of Fluid,   +bloody show   + Fetal Movement.  ROS: n HA, ono epigastric pain, no visual changes.    Pregnancy uncomplicated   Prenatal Transfer Tool  Maternal Diabetes: No Genetic Screening: Normal Maternal Ultrasounds/Referrals:  Normal Fetal Ultrasounds or other Referrals:  None Maternal Substance Abuse:  No Significant Maternal Medications:  None Significant Maternal Lab Results: Lab values include: Group B Strep negative   PNL:  GBS negative, Rub Immune, Hep B neg, RPR NR, HIV neg, GC/C neg, glucola:normal Hgb: 12.1 Blood type: O positive, antibody negative  Immunizations: Tdap: 8/29 Flu: 9/25  OBHx: primip PMHx:  none Meds:  PNV, tylenol prn Allergy:  No Known Allergies SurgHx: none SocHx:   no Tobacco, no  EtOH, no Illicit Drugs  O: BP 117/72   Pulse 85   Temp 98.8 F (37.1 C) (Oral)   Resp 20   Ht 5\' 1"  (1.549 m)   Wt 188 lb (85.3 kg)   BMI 35.52 kg/m    Gen. AAOx3, NAD Resp. CTAB, no wheeze or crackles. Abd. Gravid,  no tenderness,  no rigidity,  no guarding Extr.  1+ non-pitting edema, no calf tenderness bilaterally FHT: 140 baseline, moderate variability, + accels,  no decels Toco: irregular SVE: deferred, last exam @ 0300: 6/90/-1         Labs:  CBC Latest Ref Rng & Units 03/07/2016  WBC 4.0 - 10.5 K/uL 22.3(H)  Hemoglobin 12.0 - 15.0 g/dL 16.112.9  Hematocrit 09.636.0 - 46.0 % 35.7(L)  Platelets 150 - 400 K/uL 275    A/P:  30 y.o. G1P0 @ 4289w3d EGA who presents transitioning to active labor -FWB: Cat. I -Labor: expectant management, will recheck at 0600, consider Pit if contractions  space out -Pain management: Pt received epidural -GBS negative  Myna HidalgoJennifer Teneil Shiller, DO [phone] (pager) [phone] (office)

## 2016-03-07 NOTE — Lactation Note (Signed)
This note was copied from a baby's chart. Lactation Consultation Note  Patient Name: Diane Cross ZOXWR'UToday's Date: 03/07/2016 Reason for consult: Initial assessment Breastfeeding consultation services and support information given and reviewed with mom.  This is her first baby and infant is 558 hours old.  Mom took a breastfeeding class during pregnancy.  Baby has had one good 30 minute feeding with RN assist.  Pecola LeisureBaby is currently sleepy and waking techniques done.  Mom has short nipples but very compressible tissue. Baby opens wide and sucks for a minute before slipping off.  Both the football hold and laid back position used.  Baby went to sleep after 10 minutes of attempting latch.  Basic teaching done and questions answered.  Encouraged to call for concerns/assist prn.  Shells and hand pump given with instructions.  Maternal Data Has patient been taught Hand Expression?: Yes Does the patient have breastfeeding experience prior to this delivery?: No  Feeding Feeding Type: Breast Fed Length of feed: 6 min  LATCH Score/Interventions Latch: Repeated attempts needed to sustain latch, nipple held in mouth throughout feeding, stimulation needed to elicit sucking reflex. Intervention(s): Adjust position;Assist with latch;Breast massage;Breast compression  Audible Swallowing: None Intervention(s): Skin to skin;Hand expression Intervention(s): Hand expression;Alternate breast massage  Type of Nipple: Everted at rest and after stimulation (short ) Intervention(s): Shells;Hand pump  Comfort (Breast/Nipple): Soft / non-tender     Hold (Positioning): Assistance needed to correctly position infant at breast and maintain latch.  LATCH Score: 6  Lactation Tools Discussed/Used     Consult Status Consult Status: Follow-up Date: 03/08/16 Follow-up type: In-patient    Huston FoleyMOULDEN, Rally Ouch S 03/07/2016, 6:58 PM

## 2016-03-07 NOTE — Progress Notes (Signed)
Diane PartyStacy Cross is a 30 y.o. G1P0 at 375w3d   Subjective: In to assess pt.  Pt feeling constant pressure, which is more than previous.  Per RN, pt was Complete and 0 station @ 0720.  She attempted trial pushes b/c the pt felt pressure.  Baby did not move down.  Pt was then placed on the peanut.  Objective: BP (!) 141/70   Pulse (!) 129   Temp 97.8 F (36.6 C) (Oral)   Resp 18   Ht 5\' 1"  (1.549 m)   Wt 85.3 kg (188 lb)   SpO2 93%   BMI 35.52 kg/m  No intake/output data recorded. No intake/output data recorded.  FHT:  FHR: 150-160s bpm, variability: moderate,  accelerations:  Present,  decelerations:  Absent UC:   regular, every 4-5 minutes, palpate firm SVE:   Dilation: 10 Effacement (%): 100 Station: 0 Exam by:: Diane lima, rn  Labs: Lab Results  Component Value Date   WBC 22.3 (H) 03/07/2016   HGB 12.9 03/07/2016   HCT 35.7 (L) 03/07/2016   MCV 82.6 03/07/2016   PLT 275 03/07/2016    Assessment / Plan: Active labor at term.  Labor: Progressing normally.  Await fetal descent to +2 station. Preeclampsia:  Few mildly elevated BPs.  Continue to monitor. Fetal Wellbeing:  Category I Pain Control:  Epidural I/D:  n/a Anticipated MOD:  NSVD  Diane Cross 03/07/2016, 7:50 AM

## 2016-03-07 NOTE — Progress Notes (Addendum)
OB PN:  S: Resting comfortably in bed  O: BP 116/67   Pulse (!) 118   Temp 97.8 F (36.6 C) (Oral)   Resp 18   Ht 5\' 1"  (1.549 m)   Wt 188 lb (85.3 kg)   SpO2 93%   BMI 35.52 kg/m   FHT: 150-160 bpm, moderate variablity, +accels, no decels Toco: q3-864min SVE: C/C/0  A/P: 30 y.o. G1P0 @ 5035w3d now in active labor 1. FWB: Cat. I 2. Labor: expectant management Pain: continue epidural GBS: negative  Dr. Dion BodyVarnado to cover this am, plan for labor down until improved station.  Diane HidalgoJennifer Albion Weatherholtz, DO [phone]) [phone] (office)

## 2016-03-07 NOTE — Progress Notes (Signed)
Pt pushing with good effort.  Starting to get tired.  Fetal descent noted. Contractions q 4 min.   Mild meconium noted.  Cat 1 tracing. Urine concentrated.  150 ml over 4 hours.  Pt denies headaches, reflexes normal. Start Pitocin. Rest x 20 minutes. Check Preeclampsia labs.

## 2016-03-07 NOTE — Anesthesia Pain Management Evaluation Note (Signed)
  CRNA Pain Management Visit Note  Patient: Diane Cross, 30 y.o., female  "Hello I am a [facility] of the anesthesia [facility]. We have an anesthesia team available at all times to provide care throughout the hospital, including epidural management and anesthesia for C-section. I don't know your plan for the delivery whether it a natural birth, water birth, IV sedation, nitrous supplementation, doula or epidural, but we want to meet your pain goals."   1.Was your pain managed to your expectations on prior hospitalizations?   No prior hospitalizations  2.What is your expectation for pain management during this hospitalization?     Epidural  3.How can we help you reach that goal? Epidural in place.  Record the patient's initial score and the patient's pain goal.   Pain: 0  Pain Goal: 4 [facility] wants you to be able to say your pain was always managed very well.  AmeLie Hollars L 03/07/2016

## 2016-03-07 NOTE — Progress Notes (Signed)
Epidural catheter d/c'd at 1230 and tip intact.  Line not charted by anesthesia.

## 2016-03-07 NOTE — Progress Notes (Signed)
OB PN:  S: Pt starting to feel increased pressure  O: BP 109/73   Pulse 92   Temp 97.8 F (36.6 C) (Oral)   Resp 18   Ht 5\' 1"  (1.549 m)   Wt 188 lb (85.3 kg)   SpO2 93%   BMI 35.52 kg/m   FHT: 155 bpm, moderate variablity, + accels, no decels Toco: q3-704min SVE: 8-9/100/-1  A/P: 30 y.o. G1P0 @ 531w3d now in active labor 1. FWB: Cat. I 2. Labor: expectant management Pain: continue epidural GBS: negative   Myna HidalgoJennifer Etha Stambaugh, DO [phone]) [phone] (office)

## 2016-03-07 NOTE — Anesthesia Preprocedure Evaluation (Signed)
Anesthesia Evaluation  Patient identified by MRN, date of birth, ID band Patient awake    Reviewed: Allergy & Precautions, NPO status , Patient's Chart, lab work & pertinent test results  Airway Mallampati: II  TM Distance: >3 FB Neck ROM: Full    Dental no notable dental hx.    Pulmonary neg pulmonary ROS,    Pulmonary exam normal breath sounds clear to auscultation       Cardiovascular negative cardio ROS Normal cardiovascular exam Rhythm:Regular Rate:Normal     Neuro/Psych negative neurological ROS  negative psych ROS   GI/Hepatic negative GI ROS, Neg liver ROS,   Endo/Other  negative endocrine ROS  Renal/GU negative Renal ROS     Musculoskeletal negative musculoskeletal ROS (+)   Abdominal (+) + obese,   Peds  Hematology negative hematology ROS (+)   Anesthesia Other Findings   Reproductive/Obstetrics (+) Pregnancy                             Anesthesia Physical Anesthesia Plan  ASA: II  Anesthesia Plan: Epidural   Post-op Pain Management:    Induction:   Airway Management Planned:   Additional Equipment:   Intra-op Plan:   Post-operative Plan:   Informed Consent: I have reviewed the patients History and Physical, chart, labs and discussed the procedure including the risks, benefits and alternatives for the proposed anesthesia with the patient or authorized representative who has indicated his/her understanding and acceptance.     Plan Discussed with:   Anesthesia Plan Comments:         Anesthesia Quick Evaluation

## 2016-03-07 NOTE — MAU Note (Signed)
Reports water breaking at 0130, brownish color. Was 3cm this afternoon. Denies any problems with the pregnancy

## 2016-03-08 LAB — CBC
HEMATOCRIT: 30.5 % — AB (ref 36.0–46.0)
HEMOGLOBIN: 10.5 g/dL — AB (ref 12.0–15.0)
MCH: 29.2 pg (ref 26.0–34.0)
MCHC: 34.4 g/dL (ref 30.0–36.0)
MCV: 85 fL (ref 78.0–100.0)
Platelets: 243 10*3/uL (ref 150–400)
RBC: 3.59 MIL/uL — ABNORMAL LOW (ref 3.87–5.11)
RDW: 14.6 % (ref 11.5–15.5)
WBC: 19.2 10*3/uL — AB (ref 4.0–10.5)

## 2016-03-08 NOTE — Progress Notes (Signed)
Postpartum Note Day # 1  S:  Patient resting comfortable in bed.  Pain controlled.  Tolerating general diet. Not sure about flatus, no BM.  Lochia moderate.  Ambulating without difficulty.  She denies n/v/f/c, SOB, or CP.  Pt plans on breastfeeding, but having some issues.  O: Temp:  [97.4 F (36.3 C)-98.8 F (37.1 C)] 97.4 F (36.3 C) (11/15 0010) Pulse Rate:  [104-135] 104 (11/15 0010) Resp:  [16-20] 20 (11/15 0010) BP: (110-141)/(64-80) 130/78 (11/15 0010) SpO2:  [95 %] 95 % (11/15 0010)   Gen: A&Ox3, NAD Resp: Normal respiratory effort Abdomen: soft, NT, ND Uterus: firm, non-tender, below umbilicus Ext: 1+ non-pitting edema, no calf tenderness bilaterally  Labs:  Recent Labs  03/07/16 1003 03/08/16 0531  HGB 12.9 10.5*    A/P: Pt is a 30 y.o. G1P1001 s/p NSVD, PPD#1  - Pain well controlled -GU: UOP is adequate -GI: Tolerating general diet -Activity: encouraged sitting up to chair and ambulation as tolerated -Prophylaxis: early ambulation -Labs: stable as above - Lactation support to continue during hospital stay  DISPO: Continue routine postpartum care, plan for discharge home tomorrow.  Myna HidalgoJennifer Roselani Grajeda, DO [phone]) [phone] (office)

## 2016-03-08 NOTE — Anesthesia Postprocedure Evaluation (Signed)
Anesthesia Post Note  Patient: Diane Cross  Procedure(s) Performed: * No procedures listed *  Patient location during evaluation: Mother Baby Anesthesia Type: Epidural Level of consciousness: awake and alert and oriented Pain management: satisfactory to patient Vital Signs Assessment: post-procedure vital signs reviewed and stable Respiratory status: spontaneous breathing and nonlabored ventilation Cardiovascular status: stable Postop Assessment: no headache, no backache, no signs of nausea or vomiting, adequate PO intake and patient able to bend at knees (patient up walking) Anesthetic complications: no     Last Vitals:  Vitals:   03/08/16 0010 03/08/16 0708  BP: 130/78 119/78  Pulse: (!) 104 99  Resp: 20 18  Temp: 36.3 C 36.4 C    Last Pain:  Vitals:   03/08/16 0708  TempSrc: Oral  PainSc:    Pain Goal:                 Diane Cross,Diane Cross

## 2016-03-08 NOTE — Lactation Note (Signed)
This note was copied from a baby's chart. Lactation Consultation Note  Patient Name: Diane Cross JXBJY'NToday's Date: 03/08/2016 Reason for consult: Follow-up assessment Baby at 36 hr of life. Peds has an order to supplement baby along with bf. Mom is unable to manually express or DEBP enough colostrum to meet supplementing guidelines. Baby will latch, suck 2-3 times, then fall asleep. As soon as baby is moved from the breast she wakes and cues again. FOB reports baby does long bursts of sucking while syringe feeding, she has no trouble staying awake. Mom has short nipples with compressible breast. Mom is having lower back pain and is unable to sit up. Had mom latch baby while she was laying on her side. Showed FOB how to arrange pillows around mom to support baby. FOB was able to help apply the NS and help pre load it with formula. After placing #16 NS with formula baby maintains long bursts of sucking. Parents were pleased with the idea of keeping baby at the breast while supplementing. Mom will offer the breast on demand 8+/24hr and supplement as directed by MD. She will post pump after every feeding. If she has expressed milk she will offer it before offering formula. Parents are aware of lactation services and support group. They will call as needed.   Maternal Data    Feeding Feeding Type: Breast Fed  LATCH Score/Interventions Latch: Repeated attempts needed to sustain latch, nipple held in mouth throughout feeding, stimulation needed to elicit sucking reflex. Intervention(s): Adjust position;Assist with latch;Breast massage;Breast compression  Audible Swallowing: A few with stimulation Intervention(s): Hand expression;Skin to skin Intervention(s): Alternate breast massage  Type of Nipple: Everted at rest and after stimulation  Comfort (Breast/Nipple): Soft / non-tender     Hold (Positioning): Full assist, staff holds infant at breast Intervention(s): Support Pillows;Position  options  LATCH Score: 6  Lactation Tools Discussed/Used Tools: Nipple Shields Nipple shield size: 16   Consult Status Consult Status: Follow-up Date: 03/09/16 Follow-up type: In-patient    Rulon Eisenmengerlizabeth E Garrit Marrow 03/08/2016, 10:24 PM

## 2016-03-09 MED ORDER — IBUPROFEN 600 MG PO TABS: 600.0000 mg | ORAL_TABLET | Freq: Four times a day (QID) | ORAL | 0 refills | Status: DC

## 2016-03-09 NOTE — Discharge Instructions (Signed)

## 2016-03-09 NOTE — Lactation Note (Signed)
This note was copied from a baby's chart. Lactation Consultation Note  Patient Name: Diane Cross ZOXWR'UToday's Date: 03/09/2016     Maternal Data    Feeding Feeding Type: Breast Fed  LATCH Score/Interventions Latch: Repeated attempts needed to sustain latch, nipple held in mouth throughout feeding, stimulation needed to elicit sucking reflex. Intervention(s): Adjust position;Breast massage  Audible Swallowing: A few with stimulation Intervention(s): Skin to skin;Hand expression  Type of Nipple: Flat (semi flat but easily compressible/nipple out/after nursing) Intervention(s): No intervention needed (taught mom/dad how to compress/sandwich breast and latch bab)  Comfort (Breast/Nipple): Soft / non-tender     Hold (Positioning): Assistance needed to correctly position infant at breast and maintain latch. (mom having back pain/assistance needed in positioning)  LATCH Score: 6  Lactation Tools Discussed/Used     Consult Status Consult Status: Complete Date: 03/09/16    Diane Cross 03/09/2016, 1:09 PM

## 2016-03-09 NOTE — Progress Notes (Signed)
Postpartum Note Day # 2  S:  Patient resting comfortable in bed.  Pain controlled.  Tolerating general diet. +flatus and BM.  Lochia moderate.  Ambulating without difficulty.  She denies n/v/f/c, SOB, or CP.  Breastfeeding continues to improve.  O: Temp:  [97.6 F (36.4 C)-97.8 F (36.6 C)] 97.8 F (36.6 C) (11/16 0610) Pulse Rate:  [76-99] 76 (11/16 0610) Resp:  [16-18] 16 (11/16 0610) BP: (112-119)/(61-78) 119/68 (11/16 0610)   Gen: A&Ox3, NAD CV: RRR Lungs: CTAB, normal respiratory effort Abdomen: soft, NT, ND Uterus: firm, non-tender, below umbilicus Ext: 1+ non-pitting edema, no calf tenderness bilaterally  Labs:   Recent Labs  03/07/16 1003 03/08/16 0531  HGB 12.9 10.5*    A/P: Pt is a 30 y.o. G1P1001 s/p NSVD, PPD#2  - Pain well controlled -GU: UOP is adequate -GI: Tolerating general diet -Activity: encouraged sitting up to chair and ambulation as tolerated -Prophylaxis: early ambulation -Labs: stable as above - Lactation support to continue during hospital stay  DISPO: Meeting postpartum milestones appropriately, plan for discharge home today.  Myna HidalgoJennifer Benedicta Sultan, DO [phone]) [phone] (office)

## 2016-03-09 NOTE — Discharge Summary (Signed)
OB Discharge Summary     Patient Name: Diane Cross DOB: 07-Jan-1986 MRN: [id number]  Date of admission: 03/07/2016 Delivering MD: Geryl RankinsVARNADO, EVELYN   Date of discharge: 03/09/2016  Admitting diagnosis: 41wks, possible water broke Intrauterine pregnancy: 6122w3d     Secondary diagnosis:  Active Problems:   Indication for care in labor or delivery  Additional problems: none     Discharge diagnosis: Term Pregnancy Delivered                                                                                                Post partum procedures:n/a  Augmentation: Pitocin  Complications: None  Hospital course:  Onset of Labor With Vaginal Delivery     30 y.o. yo G1P1001 at 5222w3d was admitted in Active Labor on 03/07/2016. Patient had an uncomplicated labor course as follows:  Membrane Rupture Time/Date: 1:45 AM ,03/07/2016   Intrapartum Procedures: Episiotomy: None [1]                                         Lacerations:  2nd degree [3];Perineal [11];Labial [10]  Patient had a delivery of a Viable infant. 03/07/2016  Information for the patient's newborn:  Diane Cross [[id number]][[id number]]  Delivery Method: Vaginal, Spontaneous Delivery (Filed from Delivery Summary)    Pateint had an uncomplicated postpartum course.  She is ambulating, tolerating a regular diet, passing flatus, and urinating well. Patient is discharged home in stable condition on 03/09/16.    Physical exam Vitals:   03/08/16 0010 03/08/16 0708 03/08/16 1815 03/09/16 0610  BP: 130/78 119/78 112/61 119/68  Pulse: (!) 104 99 86 76  Resp: 20 18 18 16   Temp: 97.4 F (36.3 C) 97.6 F (36.4 C) 97.7 F (36.5 C) 97.8 F (36.6 C)  TempSrc: Oral Oral Oral Oral  SpO2: 95%     Weight:      Height:       General: alert, cooperative and no distress Lochia: appropriate Uterine Fundus: firm Incision: N/A DVT Evaluation: No evidence of DVT seen on physical exam. Labs: Lab Results  Component Value Date   WBC 19.2 (H)  03/08/2016   HGB 10.5 (L) 03/08/2016   HCT 30.5 (L) 03/08/2016   MCV 85.0 03/08/2016   PLT 243 03/08/2016   CMP Latest Ref Rng & Units 03/07/2016  Glucose 65 - 99 mg/dL 782(N113(H)  BUN 6 - 20 mg/dL 9  Creatinine 5.620.44 - 1.301.00 mg/dL 8.650.82  Sodium 784135 - 696145 mmol/L 132(L)  Potassium 3.5 - 5.1 mmol/L 3.9  Chloride 101 - 111 mmol/L 103  CO2 22 - 32 mmol/L 15(L)  Calcium 8.9 - 10.3 mg/dL 2.9(B8.8(L)  Total Protein 6.5 - 8.1 g/dL 6.6  Total Bilirubin 0.3 - 1.2 mg/dL 0.5  Alkaline Phos 38 - 126 U/L 129(H)  AST 15 - 41 U/L 29  ALT 14 - 54 U/L 14    Discharge instruction: per After Visit Summary and "Baby and Me Booklet".  After visit meds:    Medication List  STOP taking these medications   oxyCODONE-acetaminophen 5-325 MG tablet Commonly known as:  PERCOCET/ROXICET     TAKE these medications   ibuprofen 600 MG tablet Commonly known as:  ADVIL,MOTRIN Take 1 tablet (600 mg total) by mouth every 6 (six) hours.   PRENATAL GUMMIES/DHA & FA 0.4-32.5 MG Chew Chew 2 each by mouth daily.   ranitidine 150 MG tablet Commonly known as:  ZANTAC Take 150 mg by mouth 2 (two) times daily.       Diet: routine diet  Activity: Advance as tolerated. Pelvic rest for 6 weeks.   Outpatient follow up:6 weeks Follow up Appt:No future appointments. Follow up Visit:No Follow-up on file.  Postpartum contraception: Not Discussed  Newborn Data: Live born female  Birth Weight: 7 lb 13.6 oz (3560 g) APGAR: 8, 9  Baby Feeding: Breast Disposition:home with mother   03/09/2016 Myna HidalgoZAN, Jamie-Lee Galdamez, M, DO

## 2016-03-09 NOTE — Lactation Note (Signed)
This note was copied from a baby's chart. Lactation Consultation Note  Patient Name: Diane Cross NUUVO'ZToday's Date: 03/09/2016   [facility]  Mom reports baby doing very well on breast without nipple shield with previous feed this morning.  Mom states she does not want to supplement any more with formula.  LC reviews information about milk coming in and engorgement care.  LC reveiws plan with mom and dad that includes putting baby to breast on demand feeding with at least 8-12 times in 24 hours.  Also for mom to post pump after each feed throughout the day to ensure good breast stimulation and to give back any milk via finger feed. Because baby's weight reflects feeds with supplementation, LC encouraged mom to pay attention to stools and voids and to supplement after BF with EBM.       Mom has a medela pump at home.   LC provided family with purple syringe and feeding tube and demonstrated for family.  Mom and dad also have curved tip syringe that they had previously been using to supplement with via finger feed. LC encouraged mom to try to feed again in order to observe a feed.  Baby latched able to sustain latch after mom was taught to compress/sandwich breast.  Mom was taught breast massage and hand expression was reviewed.  Mom was able to easily express colostrum from breast prior to latching baby.  Mom expresses great back pain since delivery.  LC reviewed side lying and laid back nursing with mom and dad in order to increase comfort while nursing.  OP services as well as support groups reviewed with mom.  FU appointment scheduled for baby with pediatrician tomorrow at 9:45.

## 2016-03-10 ENCOUNTER — Inpatient Hospital Stay (HOSPITAL_COMMUNITY): Admission: RE | Admit: 2016-03-10 | Payer: BLUE CROSS/BLUE SHIELD | Source: Ambulatory Visit

## 2016-04-27 DIAGNOSIS — D2262 Melanocytic nevi of left upper limb, including shoulder: Secondary | ICD-10-CM | POA: Diagnosis not present

## 2016-04-27 DIAGNOSIS — L814 Other melanin hyperpigmentation: Secondary | ICD-10-CM | POA: Diagnosis not present

## 2016-05-28 DIAGNOSIS — R197 Diarrhea, unspecified: Secondary | ICD-10-CM | POA: Diagnosis not present

## 2016-05-28 DIAGNOSIS — J111 Influenza due to unidentified influenza virus with other respiratory manifestations: Secondary | ICD-10-CM | POA: Diagnosis not present

## 2016-05-28 DIAGNOSIS — R112 Nausea with vomiting, unspecified: Secondary | ICD-10-CM | POA: Diagnosis not present

## 2016-06-30 DIAGNOSIS — J00 Acute nasopharyngitis [common cold]: Secondary | ICD-10-CM | POA: Diagnosis not present

## 2016-06-30 DIAGNOSIS — R05 Cough: Secondary | ICD-10-CM | POA: Diagnosis not present

## 2016-06-30 DIAGNOSIS — R197 Diarrhea, unspecified: Secondary | ICD-10-CM | POA: Diagnosis not present

## 2016-06-30 DIAGNOSIS — J069 Acute upper respiratory infection, unspecified: Secondary | ICD-10-CM | POA: Diagnosis not present

## 2016-07-12 DIAGNOSIS — L814 Other melanin hyperpigmentation: Secondary | ICD-10-CM | POA: Diagnosis not present

## 2016-07-12 DIAGNOSIS — D1801 Hemangioma of skin and subcutaneous tissue: Secondary | ICD-10-CM | POA: Diagnosis not present

## 2016-07-25 DIAGNOSIS — Z713 Dietary counseling and surveillance: Secondary | ICD-10-CM | POA: Diagnosis not present

## 2016-08-21 ENCOUNTER — Encounter: Payer: Self-pay | Admitting: Family Medicine

## 2016-08-21 ENCOUNTER — Ambulatory Visit (INDEPENDENT_AMBULATORY_CARE_PROVIDER_SITE_OTHER): Payer: BLUE CROSS/BLUE SHIELD | Admitting: Family Medicine

## 2016-08-21 VITALS — BP 108/78 | HR 100 | Temp 98.3°F | Resp 20 | Ht 61.0 in | Wt 169.5 lb

## 2016-08-21 DIAGNOSIS — J320 Chronic maxillary sinusitis: Secondary | ICD-10-CM

## 2016-08-21 DIAGNOSIS — Z7689 Persons encountering health services in other specified circumstances: Secondary | ICD-10-CM | POA: Diagnosis not present

## 2016-08-21 DIAGNOSIS — J351 Hypertrophy of tonsils: Secondary | ICD-10-CM

## 2016-08-21 DIAGNOSIS — R591 Generalized enlarged lymph nodes: Secondary | ICD-10-CM | POA: Diagnosis not present

## 2016-08-21 MED ORDER — PREDNISONE 50 MG PO TABS: 50.0000 mg | ORAL_TABLET | Freq: Every day | ORAL | 0 refills | Status: DC

## 2016-08-21 MED ORDER — FLUTICASONE PROPIONATE 50 MCG/ACT NA SUSP: 2.0000 | Freq: Every day | NASAL | 6 refills | Status: DC

## 2016-08-21 MED ORDER — LEVOCETIRIZINE DIHYDROCHLORIDE 5 MG PO TABS: 5.0000 mg | ORAL_TABLET | Freq: Every evening | ORAL | 1 refills | Status: DC

## 2016-08-21 MED ORDER — AMOXICILLIN-POT CLAVULANATE 875-125 MG PO TABS: 1.0000 | ORAL_TABLET | Freq: Two times a day (BID) | ORAL | 0 refills | Status: DC

## 2016-08-21 NOTE — Patient Instructions (Signed)
Start xyzal and Flonase everyday, follow label instruction for allergies. Augmentin and prednisone for illness.   Nasal saline flushes daily.   Humidifier by bed.  Change air filters and make sure allergy filter.  Follow up in 4 weeks if not improve.    Please help [place] help you:  We are honored you have chosen [facility] for your Primary Care home. Below you will find basic instructions that you may need to access in the future. Please help [place] help you by reading the instructions, which cover many of the frequent questions we experience.   Prescription refills and request:  -In order to allow more efficient response time, please call your pharmacy for all refills. They will forward the request electronically to [place]. This allows for the quickest possible response. Request left on a nurse line can take longer to refill, since these are checked as time allows between office patients and other phone calls.  - refill request can take up to 3-5 working days to complete.  - If request is sent electronically and request is appropiate, it is usually completed in 1-2 business days.  - all patients will need to be seen routinely for all chronic medical conditions requiring prescription medications (see follow-up below). If you are overdue for follow up on your condition, you will be asked to make an appointment and we will call in enough medication to cover you until your appointment (up to 30 days).  - all controlled substances will require a face to face visit to request/refill.  - if you desire your prescriptions to go through a new pharmacy, and have an active script at original pharmacy, you will need to call your pharmacy and have scripts transferred to new pharmacy. This is completed between the pharmacy locations and not by your provider.    Results: If any images or labs were ordered, it can take up to 1 week to get results depending on the test ordered and the lab/facility running and  resulting the test. - Normal or stable results, which do not need further discussion, may be released to your mychart immediately with attached note to you. A call may not be generated for normal results. Please make certain to sign up for mychart. If you have questions on how to activate your mychart you can call the front office.  - If your results need further discussion, our office will attempt to contact you via phone, and if unable to reach you after 2 attempts, we will release your abnormal result to your mychart with instructions.  - All results will be automatically released in mychart after 1 week.  - Your provider will provide you with explanation and instruction on all relevant material in your results. Please keep in mind, results and labs may appear confusing or abnormal to the untrained eye, but it does not mean they are actually abnormal for you personally. If you have any questions about your results that are not covered, or you desire more detailed explanation than what was provided, you should make an appointment with your provider to do so.   Our office handles many outgoing and incoming calls daily. If we have not contacted you within 1 week about your results, please check your mychart to see if there is a message first and if not, then contact our office.  In helping with this matter, you help decrease call volume, and therefore allow [place] to be able to respond to patients needs more efficiently.  Acute office visits (sick visit):  An acute visit is intended for a new problem and are scheduled in shorter time slots to allow schedule openings for patients with new problems. This is the appropriate visit to discuss a new problem. In order to provide you with excellent quality medical care with proper time for you to explain your problem, have an exam and receive treatment with instructions, these appointments should be limited to one new problem per visit. If you experience a new  problem, in which you desire to be addressed, please make an acute office visit, we save openings on the schedule to accommodate you. Please do not save your new problem for any other type of visit, let us take care of it properly and quickly for you.   Follow up visits:  Depending on your condition(s) your provider will need to see you routinely in order to provide you with quality care and prescribe medication(s). Most chronic conditions (Example: hypertension, Diabetes, depression/anxiety... etc), require visits a couple times a year. Your provider will instruct you on proper follow up for your personal medical conditions and history. Please make certain to make follow up appointments for your condition as instructed. Failing to do so could result in lapse in your medication treatment/refills. If you request a refill, and are overdue to be seen on a condition, we will always provide you with a 30 day script (once) to allow you time to schedule.    Medicare wellness (well visit): - we have a wonderful Nurse Selena Batten), that will meet with you and provide you will yearly medicare wellness visits. These visits should occur yearly (can not be scheduled less than 1 calendar year apart) and cover preventive health, immunizations, advance directives and screenings you are entitled to yearly through your medicare benefits. Do not miss out on your entitled benefits, this is when medicare will pay for these benefits to be ordered for you.  These are strongly encouraged by your provider and is the appropriate type of visit to make certain you are up to date with all preventive health benefits. If you have not had your medicare wellness exam in the last 12 months, please make certain to schedule one by calling the office and schedule your medicare wellness with Selena Batten as soon as possible.   Yearly physical (well visit):  - Adults are recommended to be seen yearly for physicals. Check with your insurance and date of your  last physical, most insurances require one calendar year between physicals. Physicals include all preventive health topics, screenings, medical exam and labs that are appropriate for gender/age and history. You may have fasting labs needed at this visit. This is a well visit (not a sick visit), new problems should not be covered during this visit (see acute visit).  - Pediatric patients are seen more frequently when they are younger. Your provider will advise you on well child visit timing that is appropriate for your their age. - This is not a medicare wellness visit. Medicare wellness exams do not have an exam portion to the visit. Some medicare companies allow for a physical, some do not allow a yearly physical. If your medicare allows a yearly physical you can schedule the medicare wellness with our nurse Selena Batten and have your physical with your provider after, on the same day. Please check with insurance for your full benefits.   Late Policy/No Shows:  - all new patients should arrive 15-30 minutes earlier than appointment to allow [place] time  to  obtain all personal demographics,  insurance information and for you to complete office paperwork. - All established patients should arrive 10-15 minutes earlier than appointment time to update all information and be checked in .  - In our best efforts to run on time, if you are late for your appointment you will be asked to either reschedule or if able, we will work you back into the schedule. There will be a wait time to work you back in the schedule,  depending on availability.  - If you are unable to make it to your appointment as scheduled, please call 24 hours ahead of time to allow [place] to fill the time slot with someone else who needs to be seen. If you do not cancel your appointment ahead of time, you may be charged a no show fee.

## 2016-08-21 NOTE — Progress Notes (Signed)
Patient ID: Diane Cross, female  DOB: 12/16/1985, 31 y.o.   MRN: [id number] Patient Care Team    Relationship Specialty Notifications Start End  Natalia Leatherwood, DO PCP - General Family Medicine  08/21/16   Myna Hidalgo, DO Consulting Physician Obstetrics and Gynecology  08/21/16     Chief Complaint  Patient presents with  . Establish Care  . Allergic Rhinitis     Subjective:  Diane Cross is a 31 y.o.  female present for new patient establishment. All past medical history, surgical history, allergies, family history, immunizations, medications and social history were obtained and updated in the electronic medical record today. All recent labs, ED visits and hospitalizations within the last year were reviewed.  Chronic sinus: Patient states that she's had chronic sinus problems for years. She is currently only taking Claritin-D and using Afrin when necessary. She states she feels she is constantly congested, can't breathe out of her nose, snores at night. She endorses recent increase in a sore throat within the last week. Coughing up green 10 sputum. She endorses low-grade fever.  Health maintenance:  Colonoscopy: no fhx. Screen at 50 Mammogram: no fhx screen at 40, has GYN Cervical cancer screening: [facility]. last pap: 02/2014, results: [place], NP, (Dr. Emmit Alexanders.  Immunizations: tdap UTD 10/2015, Influenza  (encouraged yearly) Infectious disease screening: HIV completed DEXA: N/A  Depression screen [facility] 2/9 08/21/2016  Decreased Interest 0  Down, Depressed, Hopeless 0  PHQ - 2 Score 0   No flowsheet data found.    Current Exercise Habits: The patient does not participate in regular exercise at present Exercise limited by: None identified Fall Risk  08/21/2016  Falls in the past year? No     There is no immunization history for the selected administration types on file for this patient.  No exam data present  Past Medical History:  Diagnosis Date  .  Allergy   . Anxiety   . Migraines    No Known Allergies History reviewed. No pertinent surgical history. Family History  Problem Relation Age of Onset  . Hypertension Mother   . Heart attack Father   . Autism Brother   . Diabetes Maternal Grandmother   . Diabetes Maternal Grandfather    Social History   Social History  . Marital status: Married    Spouse name: N/A  . Number of children: N/A  . Years of education: N/A   Occupational History  . Not on file.   Social History Main Topics  . Smoking status: Never Smoker  . Smokeless tobacco: Never Used  . Alcohol use No  . Drug use: No  . Sexual activity: Yes    Birth control/ protection: Condom   Other Topics Concern  . Not on file   Social History Narrative  . No narrative on file   Allergies as of 08/21/2016   No Known Allergies     Medication List       Accurate as of 08/21/16  1:32 PM. Always use your most recent med list.          amoxicillin-clavulanate 875-125 MG tablet Commonly known as:  AUGMENTIN Take 1 tablet by mouth 2 (two) times daily.   fluticasone 50 MCG/ACT nasal spray Commonly known as:  FLONASE Place 2 sprays into both nostrils daily.   levocetirizine 5 MG tablet Commonly known as:  XYZAL Take 1 tablet (5 mg total) by mouth every evening.   predniSONE 50 MG tablet Commonly known as:  DELTASONE Take 1 tablet (50 mg total) by mouth daily with breakfast.       All past medical history, surgical history, allergies, family history, immunizations andmedications were updated in the EMR today and reviewed under the history and medication portions of their EMR.    No results found for this or any previous visit (from the past 2160 hour(s)).  No results found.   ROS: 14 pt review of systems performed and negative (unless mentioned in an HPI)  Objective: BP 108/78 (BP Location: Left Arm, Patient Position: Sitting, Cuff Size: Large)   Pulse 100   Temp 98.3 F (36.8 C)   Resp 20   Ht   (1.549 m)   Wt 169 lb 8 oz (76.9 kg)   LMP 07/24/2016   SpO2 98%   BMI 32.03 kg/m  Gen: Afebrile. No acute distress. Nontoxic in appearance, well-developed, well-nourished,  pleasant caucasian female.  HENT: AT. [place]. Bilateral TM visualized with bilateral fullness and air fluid levels.  normal external auditory canal. MMM, no oral lesions, adequate dentition. Bilateral nares within normal limits, fullness left nare. Throat without erythema, ulcerations. Mild exudative left, bilateral enlarged tonsils. present Cough on exam, present hoarseness on exam. Eyes:Pupils Equal Round Reactive to light, Extraocular movements intact,  Conjunctiva without redness, discharge or icterus. Neck/lymp/endocrine: Supple,bilateral ant cervical  Lymphadenopathy CV: RRR, no edema Chest: CTAB, no wheeze, rhonchi or crackles.  Abd: Soft. NTND. BS present.  Skin: no rashes, purpura or petechiae. Warm and well-perfused. Skin intact. Neuro/Msk:  Normal gait. PERLA. EOMi. Alert. Oriented x3.   Psych: Normal affect, dress and demeanor. Normal speech. Normal thought content and judgment.   Assessment/plan: Diane Cross is a 31 y.o. female present for establish care with sinus congestion.  Maxillary sinusitis, unspecified chronicity Enlarged tonsils  Lymphadenopathy - Discussed rest, hydration, humidifier use and changing the filters in her home. - Bilateral lymphadenopathy is rather significant today. - Augmentin twice a day 10 days, prednisone burst for 5 days. - Start Xyzal and Flonase daily. Both prescribed. - Discontinue Afrin and decongestant products. - Follow-up in 4 weeks, sooner if symptoms are worsening. Could also consider singular addition versus ENT referral if lymphadenopathy/enlarged tonsils do not improve.  Return in about 4 weeks (around 09/18/2016), or if symptoms worsen or fail to improve.   Note is dictated utilizing voice recognition software. Although note has been proof read prior to  signing, occasional typographical errors still can be missed. If any questions arise, please do not hesitate to call for verification.  Electronically signed by: Felix Pacini, DO [facility] [place]

## 2016-09-01 DIAGNOSIS — Z713 Dietary counseling and surveillance: Secondary | ICD-10-CM | POA: Diagnosis not present

## 2016-10-23 ENCOUNTER — Encounter: Payer: Self-pay | Admitting: Family Medicine

## 2016-10-23 ENCOUNTER — Ambulatory Visit (INDEPENDENT_AMBULATORY_CARE_PROVIDER_SITE_OTHER): Payer: BLUE CROSS/BLUE SHIELD | Admitting: Family Medicine

## 2016-10-23 VITALS — BP 121/82 | HR 87 | Temp 98.1°F | Resp 20 | Wt 176.0 lb

## 2016-10-23 DIAGNOSIS — R591 Generalized enlarged lymph nodes: Secondary | ICD-10-CM | POA: Diagnosis not present

## 2016-10-23 DIAGNOSIS — M25832 Other specified joint disorders, left wrist: Secondary | ICD-10-CM

## 2016-10-23 DIAGNOSIS — J0100 Acute maxillary sinusitis, unspecified: Secondary | ICD-10-CM

## 2016-10-23 MED ORDER — AMOXICILLIN-POT CLAVULANATE 875-125 MG PO TABS: 1.0000 | ORAL_TABLET | Freq: Two times a day (BID) | ORAL | 0 refills | Status: DC

## 2016-10-23 MED ORDER — PREDNISONE 50 MG PO TABS: 50.0000 mg | ORAL_TABLET | Freq: Every day | ORAL | 0 refills | Status: DC

## 2016-10-23 NOTE — Patient Instructions (Addendum)
Rest, hydrate.  +/- flonase, mucinex (DM if cough), nettie pot or nasal saline.  Augmentin and predisone prescribed, take until completed.  If cough present it can last up to 6-8 weeks.  F/U 2 weeks of not improved.    Lymphadenopathy Lymphadenopathy refers to swollen or enlarged lymph glands, also called lymph nodes. Lymph glands are part of your body's defense (immune) system, which protects the body from infections, germs, and diseases. Lymph glands are found in many locations in your body, including the neck, underarm, and groin. Many things can cause lymph glands to become enlarged. When your immune system responds to germs, such as viruses or bacteria, infection-fighting cells and fluid build up. This causes the glands to grow in size. Usually, this is not something to worry about. The swelling and any soreness often go away without treatment. However, swollen lymph glands can also be caused by a number of diseases. Your health care provider may do various tests to help determine the cause. If the cause of your swollen lymph glands cannot be found, it is important to monitor your condition to make sure the swelling goes away. Follow these instructions at home: Watch your condition for any changes. The following actions may help to lessen any discomfort you are feeling:  Get plenty of rest.  Take medicines only as directed by your health care provider. Your health care provider may recommend over-the-counter medicines for pain.  Apply moist heat compresses to the site of swollen lymph nodes as directed by your health care provider. This can help reduce any pain.  Check your lymph nodes daily for any changes.  Keep all follow-up visits as directed by your health care provider. This is important.  Contact a health care provider if:  Your lymph nodes are still swollen after 2 weeks.  Your swelling increases or spreads to other areas.  Your lymph nodes are hard, seem fixed to the skin,  or are growing rapidly.  Your skin over the lymph nodes is red and inflamed.  You have a fever.  You have chills.  You have fatigue.  You develop a sore throat.  You have abdominal pain.  You have weight loss.  You have night sweats. Get help right away if:  You notice fluid leaking from the area of the enlarged lymph node.  You have severe pain in any area of your body.  You have chest pain.  You have shortness of breath. This information is not intended to replace advice given to you by your health care provider. Make sure you discuss any questions you have with your health care provider. Document Released: 01/18/2008 Document Revised: 09/16/2015 Document Reviewed: 11/13/2013 Elsevier Interactive Patient Education  2018 [employer]   Please have xray of wrist completed. After I get the results I will place a referral to orthopedics for you.

## 2016-10-23 NOTE — Progress Notes (Signed)
Diane Cross , [birth year], 31 y.o., female MRN: [id number] Patient Care Team    Relationship Specialty Notifications Start End  Diane Cross, Diane Soltis Cross, Diane Cross PCP - General Family Medicine  08/21/16   Diane Cross, Jennifer, Diane Cross Consulting Physician Obstetrics and Gynecology  08/21/16     Chief Complaint  Patient presents with  . Cough    drainage,throat sore     Subjective: Pt presents for an OV with complaints of Nasal drainage and sore throat of one week duration.  Associated symptoms include cough that is worse at night, chills that occurred last week.Pt has tried Flonase, Xyzal  to ease their symptoms. She denies nausea, vomit, fever, abdominal pain or diarrhea. She states that her lymph nodes are again swollen. This had occurred in Cross similar fashion in April 2018, which she was prescribed Augmentin and prednisone burst. She reports that her symptoms had completely resolved after that treatment including her lymphadenopathy.  Left wrist mass: Patient also complains of Cross left wrist mass that has been present for Cross few weeks. She reports that she's had trouble with that wrist in the past when exercising and lifting, but did not notice be enlarging mass until Cross few weeks ago. She denies any recent injury to her wrist.  [facility] 2/9 08/21/2016  Decreased Interest 0  Down, Depressed, Hopeless 0  PHQ - 2 Score 0    No Known Allergies Social History  Substance Use Topics  . Smoking status: Never Smoker  . Smokeless tobacco: Never Used  . Alcohol use No   Past Medical History:  Diagnosis Date  . Allergy   . Anxiety   . Migraines    History reviewed. No pertinent surgical history. Family History  Problem Relation Age of Onset  . Hypertension Mother   . Heart attack Father   . Autism Brother   . Diabetes Maternal Grandmother   . Diabetes Maternal Grandfather    Allergies as of 10/23/2016   No Known Allergies     Medication List       Accurate as of 10/23/16 10:54 AM. Always use  your most recent med list.          fluticasone 50 MCG/ACT nasal spray Commonly known as:  FLONASE Place 2 sprays into both nostrils daily.   levocetirizine 5 MG tablet Commonly known as:  XYZAL Take 1 tablet (5 mg total) by mouth every evening.       All past medical history, surgical history, allergies, family history, immunizations andmedications were updated in the EMR today and reviewed under the history and medication portions of their EMR.     ROS: Negative, with the exception of above mentioned in HPI   Objective:  BP 121/82 (BP Location: Right Arm, Patient Position: Sitting, Cuff Size: Large)   Pulse 87   Temp 98.1 F (36.7 C)   Resp 20   Wt 176 lb (79.8 kg)   SpO2 98%   BMI 33.25 kg/m  Body mass index is 33.25 kg/m. Gen: Afebrile. No acute distress. Nontoxic in appearance, well developed, well nourished.  HENT: AT. [place]. Bilateral TM visualized with mild fullness, no erythema. MMM, no oral lesions. Bilateral nares erythema, drainage. Throat without erythema or exudates. Postnasal drip present. Mild cough present. No hoarseness. Eyes:Pupils Equal Round Reactive to light, Extraocular movements intact,  Conjunctiva without redness, discharge or icterus. Neck/lymp/endocrine: Supple, moderate bilateral anterior cervical lymphadenopathy R>L CV: RRR Chest: CTAB, no wheeze or crackles. Good air movement, normal resp effort.  Skin: no rashes, purpura or petechiae.  Extremity: Soft, mildly mobile Left wrist mass 3 cm x 3 cm radial aspect. No tenderness to palpation. No erythema. Neuro:  Normal gait. PERLA. EOMi. Alert. Oriented x3 No exam data present No results found. No results found for this or any previous visit (from the past 24 hour(s)).  Assessment/Plan: Diane Cross is Cross 31 y.o. female present for OV for  Acute non-recurrent maxillary sinusitis Lymphadenopathy - New - Discussed lymphadenopathy with patient today. If she is getting complete resolution of  lymphadenopathy after antibiotic, then it is okay. However the if the lymph nodes are not returning to baseline, then I would need to see her back for workup on her lymphadenopathy. - Rest, hydrate, continue Flonase and Xyzal. Start Mucinex DM. Prescribed amoxicillin and prednisone burst. - amoxicillin-clavulanate (AUGMENTIN) 875-125 MG tablet; Take 1 tablet by mouth 2 (two) times daily.  Dispense: 20 tablet; Refill: 0 - predniSONE (DELTASONE) 50 MG tablet; Take 1 tablet (50 mg total) by mouth daily with breakfast.  Dispense: 5 tablet; Refill: 0 - Follow-up 2 weeks when necessary  Mass of joint of left wrist - New - No known injury. ~3 cm x 3 cm mass, consistent with ganglion cyst. Discussed this with her today and she opted to have x-ray completed within the next few days. We discussed her options, and she would like referral to orthopedics. We'll place Cross referral after x-ray results received. - DG Wrist Complete Left; Future   Reviewed expectations re: course of current medical issues.  Discussed self-management of symptoms.  Outlined signs and symptoms indicating need for more acute intervention.  Patient verbalized understanding and all questions were answered.  Patient received an After-Visit Summary.   Note is dictated utilizing voice recognition software. Although note has been proof read prior to signing, occasional typographical errors still can be missed. If any questions arise, please Diane Cross not hesitate to call for verification.   electronically signed by:  Felix Pacini, Diane Cross  [facility] [place]

## 2016-10-26 ENCOUNTER — Ambulatory Visit (HOSPITAL_BASED_OUTPATIENT_CLINIC_OR_DEPARTMENT_OTHER)
Admission: RE | Admit: 2016-10-26 | Discharge: 2016-10-26 | Disposition: A | Discharge status: Home / Self Care | Payer: BLUE CROSS/BLUE SHIELD | Source: Ambulatory Visit | Attending: Family Medicine | Admitting: Family Medicine

## 2016-10-26 DIAGNOSIS — M25832 Other specified joint disorders, left wrist: Secondary | ICD-10-CM

## 2016-10-26 DIAGNOSIS — M25532 Pain in left wrist: Secondary | ICD-10-CM | POA: Diagnosis not present

## 2016-10-27 ENCOUNTER — Ambulatory Visit: Payer: Self-pay | Admitting: Family Medicine

## 2016-10-27 ENCOUNTER — Telehealth: Payer: Self-pay | Admitting: Family Medicine

## 2016-10-27 DIAGNOSIS — R2230 Localized swelling, mass and lump, unspecified upper limb: Secondary | ICD-10-CM

## 2016-10-27 NOTE — Telephone Encounter (Signed)
Please call pt: - her Xray did not show evidence of any bone abnormality. I have referred her to a hand specialist to discuss the cyst of her wrist.

## 2016-10-27 NOTE — Telephone Encounter (Signed)
Left detailed message with results and information on patient voice mail per [facility]

## 2016-12-15 DIAGNOSIS — Z0000 Encounter for general adult medical examination without abnormal findings: Secondary | ICD-10-CM | POA: Diagnosis not present

## 2017-01-05 ENCOUNTER — Ambulatory Visit: Payer: Self-pay | Admitting: Family Medicine

## 2017-01-10 ENCOUNTER — Ambulatory Visit (INDEPENDENT_AMBULATORY_CARE_PROVIDER_SITE_OTHER): Payer: BLUE CROSS/BLUE SHIELD | Admitting: Family Medicine

## 2017-01-10 ENCOUNTER — Encounter: Payer: Self-pay | Admitting: Family Medicine

## 2017-01-10 VITALS — BP 114/81 | HR 84 | Temp 98.2°F | Resp 20 | Wt 179.5 lb

## 2017-01-10 DIAGNOSIS — R591 Generalized enlarged lymph nodes: Secondary | ICD-10-CM

## 2017-01-10 DIAGNOSIS — J301 Allergic rhinitis due to pollen: Secondary | ICD-10-CM | POA: Diagnosis not present

## 2017-01-10 DIAGNOSIS — J329 Chronic sinusitis, unspecified: Secondary | ICD-10-CM | POA: Diagnosis not present

## 2017-01-10 MED ORDER — TRIAMCINOLONE ACETONIDE 55 MCG/ACT NA AERO: 2.0000 | INHALATION_SPRAY | Freq: Every day | NASAL | 12 refills | Status: DC

## 2017-01-10 MED ORDER — LEVOCETIRIZINE DIHYDROCHLORIDE 5 MG PO TABS: 5.0000 mg | ORAL_TABLET | Freq: Every evening | ORAL | 1 refills | Status: DC

## 2017-01-10 MED ORDER — PREDNISONE 50 MG PO TABS: ORAL_TABLET | ORAL | 0 refills | Status: DC

## 2017-01-10 MED ORDER — MONTELUKAST SODIUM 10 MG PO TABS: 10.0000 mg | ORAL_TABLET | Freq: Every day | ORAL | 1 refills | Status: DC

## 2017-01-10 NOTE — Progress Notes (Signed)
Diane Cross , [birth year], 31 y.o., female MRN: [id number] Patient Care Team    Relationship Specialty Notifications Start End  Diane Leatherwood, DO PCP - General Family Medicine  08/21/16   Diane Hidalgo, DO Consulting Physician Obstetrics and Gynecology  08/21/16     Chief Complaint  Patient presents with  . Sore Throat    drainage     Subjective: Pt presents for an OV with complaints of  Chronic Sinusitis/seasonal allergies/allergic rhinitis: Pt returns today to discuss her allergies and lymphadenopathy. This is now her 3rd visit  over the last 6 months for this issue. She reports she has always had complete recovery of symptoms and lymphadenopathy with tx. She reports today she has 2 repeat "sore throat" episodes since being seen in 2 months ago. She took "left over" abx and steroids and it went away both times. She is here today bc she is still have sinus symptoms. She denies sore throat today. She is unsure if her nodes are swollen. She denies fever, chills, nausea or vomit. She endorses runny nose, nasal congestion and sinus pressure. She states she is taking the xyzal and nasal spray when needed only.     Depression screen PHQ 2/9 08/21/2016  Decreased Interest 0  Down, Depressed, Hopeless 0  PHQ - 2 Score 0    No Known Allergies Social History  Substance Use Topics  . Smoking status: Never Smoker  . Smokeless tobacco: Never Used  . Alcohol use No   Past Medical History:  Diagnosis Date  . Allergy   . Anxiety   . Migraines    History reviewed. No pertinent surgical history. Family History  Problem Relation Age of Onset  . Hypertension Mother   . Heart attack Father   . Autism Brother   . Diabetes Maternal Grandmother   . Diabetes Maternal Grandfather    Allergies as of 01/10/2017   No Known Allergies     Medication List       Accurate as of 01/10/17  1:04 PM. Always use your most recent med list.          fluticasone 50 MCG/ACT nasal  spray Commonly known as:  FLONASE Place 2 sprays into both nostrils daily.   levocetirizine 5 MG tablet Commonly known as:  XYZAL Take 1 tablet (5 mg total) by mouth every evening.       All past medical history, surgical history, allergies, family history, immunizations andmedications were updated in the EMR today and reviewed under the history and medication portions of their EMR.     ROS: Negative, with the exception of above mentioned in HPI   Objective:  BP 114/81 (BP Location: Right Arm, Patient Position: Sitting, Cuff Size: Large)   Pulse 84   Temp 98.2 F (36.8 C)   Resp 20   Wt 179 lb 8 oz (81.4 kg)   SpO2 97%   BMI 33.92 kg/m  Body mass index is 33.92 kg/m. Gen: Afebrile. No acute distress.  HENT: AT. [place]. Bilateral TM visualized with fulness bilaterally. No erythema. MMM. Bilateral nares with drainage, no erythema or swelling. Throat without erythema or exudates. No cough, PND present. No hoarseness.  Eyes:Pupils Equal Round Reactive to light, Extraocular movements intact,  Conjunctiva without redness, discharge or icterus. Neck/lymp/endocrine: Supple,bilaterlal  Submandibular Lymphadenopathy present.  CV: RRR  Chest: CTAB, no wheeze or crackles Skin: no rashes, purpura or petechiae.  Neuro:  Normal gait. PERLA. EOMi. Alert. Oriented x3  No exam data  present No results found. No results found for this or any previous visit (from the past 24 hour(s)).  Assessment/Plan: [name] is a 31 y.o. female present for OV for  Sinusitis Lymphadenopathy - ongoing. Difficult to say if she has ongoing issues or noncompliance with meds by visit today. Does not appear infectious.  - prednisone burst for 5 days.  - Encouraged her to take xyzal DAILY.  - added Singulair to take nightly to her regimen.  - changed flonase to Nasacort today.  - Discussed lymphadenopathy again with patient today. She reports lymph nodes resolved but have returned a few times over the last  few months. DISCOURAGED her from not completing steroids/abx and taking left over meds. Discussed abx resistance and creating confusion on diagnosis.  - F/U 3 weeks. If lymphadenopathy present  would image.   **after visit pt asks about starting new medication for anxiety. Discussed briefly with her and told her to schedule an appt to discuss in more detail and allow proper evaluation. She reports she is in no hurry, but was just thinking about it.     Reviewed expectations re: course of current medical issues.  Discussed self-management of symptoms.  Outlined signs and symptoms indicating need for more acute intervention.  Patient verbalized understanding and all questions were answered.  Patient received an After-Visit Summary.   Note is dictated utilizing voice recognition software. Although note has been proof read prior to signing, occasional typographical errors still can be missed. If any questions arise, please do not hesitate to call for verification.   electronically signed by:  Diane Pacini, DO  [facility] - [place]

## 2017-01-10 NOTE — Patient Instructions (Signed)
Start Singulair tonight, and take nightly with Xyzal. Must take both everyday to work right.   New nasal spray to use daily.   Prednisone for 5 days.  Make sure to follow up in 3 weeks if lymph nodes are not completely resolved.   If you want to discuss medication start for anxiety/depression etc, then please make an appt to discuss at your earliest convenience.    Lymphadenopathy Lymphadenopathy refers to swollen or enlarged lymph glands, also called lymph nodes. Lymph glands are part of your body's defense (immune) system, which protects the body from infections, germs, and diseases. Lymph glands are found in many locations in your body, including the neck, underarm, and groin. Many things can cause lymph glands to become enlarged. When your immune system responds to germs, such as viruses or bacteria, infection-fighting cells and fluid build up. This causes the glands to grow in size. Usually, this is not something to worry about. The swelling and any soreness often go away without treatment. However, swollen lymph glands can also be caused by a number of diseases. Your health care provider may do various tests to help determine the cause. If the cause of your swollen lymph glands cannot be found, it is important to monitor your condition to make sure the swelling goes away. Follow these instructions at home: Watch your condition for any changes. The following actions may help to lessen any discomfort you are feeling:  Get plenty of rest.  Take medicines only as directed by your health care provider. Your health care provider may recommend over-the-counter medicines for pain.  Apply moist heat compresses to the site of swollen lymph nodes as directed by your health care provider. This can help reduce any pain.  Check your lymph nodes daily for any changes.  Keep all follow-up visits as directed by your health care provider. This is important.  Contact a health care provider if:  Your  lymph nodes are still swollen after 2 weeks.  Your swelling increases or spreads to other areas.  Your lymph nodes are hard, seem fixed to the skin, or are growing rapidly.  Your skin over the lymph nodes is red and inflamed.  You have a fever.  You have chills.  You have fatigue.  You develop a sore throat.  You have abdominal pain.  You have weight loss.  You have night sweats. Get help right away if:  You notice fluid leaking from the area of the enlarged lymph node.  You have severe pain in any area of your body.  You have chest pain.  You have shortness of breath. This information is not intended to replace advice given to you by your health care provider. Make sure you discuss any questions you have with your health care provider. Document Released: 01/18/2008 Document Revised: 09/16/2015 Document Reviewed: 11/13/2013 Elsevier Interactive Patient Education  [employer].

## 2017-01-29 ENCOUNTER — Ambulatory Visit (INDEPENDENT_AMBULATORY_CARE_PROVIDER_SITE_OTHER): Payer: BLUE CROSS/BLUE SHIELD | Admitting: Family Medicine

## 2017-01-29 ENCOUNTER — Encounter: Payer: Self-pay | Admitting: Family Medicine

## 2017-01-29 VITALS — BP 123/85 | HR 77 | Temp 97.8°F | Resp 20 | Ht 61.0 in | Wt 183.8 lb

## 2017-01-29 DIAGNOSIS — E041 Nontoxic single thyroid nodule: Secondary | ICD-10-CM | POA: Diagnosis not present

## 2017-01-29 DIAGNOSIS — R591 Generalized enlarged lymph nodes: Secondary | ICD-10-CM

## 2017-01-29 LAB — TSH: TSH: 1.22 u[IU]/mL (ref 0.35–4.50)

## 2017-01-29 LAB — CBC WITH DIFFERENTIAL/PLATELET
BASOS PCT: 1.3 % (ref 0.0–3.0)
Basophils Absolute: 0.1 10*3/uL (ref 0.0–0.1)
EOS ABS: 0.6 10*3/uL (ref 0.0–0.7)
EOS PCT: 6.8 % — AB (ref 0.0–5.0)
HEMATOCRIT: 41.9 % (ref 36.0–46.0)
HEMOGLOBIN: 14.3 g/dL (ref 12.0–15.0)
LYMPHS PCT: 27.9 % (ref 12.0–46.0)
Lymphs Abs: 2.6 10*3/uL (ref 0.7–4.0)
MCHC: 34 g/dL (ref 30.0–36.0)
MCV: 85.8 fl (ref 78.0–100.0)
MONOS PCT: 4.1 % (ref 3.0–12.0)
Monocytes Absolute: 0.4 10*3/uL (ref 0.1–1.0)
NEUTROS ABS: 5.6 10*3/uL (ref 1.4–7.7)
Neutrophils Relative %: 59.9 % (ref 43.0–77.0)
Platelets: 370 10*3/uL (ref 150.0–400.0)
RBC: 4.88 Mil/uL (ref 3.87–5.11)
RDW: 13.8 % (ref 11.5–15.5)
WBC: 9.3 10*3/uL (ref 4.0–10.5)

## 2017-01-29 LAB — T3, FREE: T3, Free: 3.6 pg/mL (ref 2.3–4.2)

## 2017-01-29 LAB — T4, FREE: Free T4: 0.92 ng/dL (ref 0.60–1.60)

## 2017-01-29 NOTE — Progress Notes (Signed)
Diane Cross , [birth year], 31 y.o., female MRN: [id number] Patient Care Team    Relationship Specialty Notifications Start End  Diane Leatherwood, DO PCP - General Family Medicine  08/21/16   Myna Hidalgo, DO Consulting Physician Obstetrics and Gynecology  08/21/16     Chief Complaint  Patient presents with  . Adenopathy    neck     Subjective: Pt presents for an OV with complaints of  Lymphadenopathy: Patient returns today for follow-up on her cervical lymphadenopathy. She reports compliance with Singulair, Nasacort and Xyzal daily. She has been symptom free since last visit approximately 3 weeks ago. She does however still feel her lymphadenopathy is present. She also reports today that she has had a "fatty tumor" removed in the past and her neck (this was not on her medical history).   Prior no 01/10/2017: Pt returns today to discuss her allergies and lymphadenopathy. This is now her 3rd visit  over the last 6 months for this issue. She reports she has always had complete recovery of symptoms and lymphadenopathy with tx. She reports today she has 2 repeat "sore throat" episodes since being seen in 2 months ago. She took "left over" abx and steroids and it went away both times. She is here today bc she is still have sinus symptoms. She denies sore throat today. She is unsure if her nodes are swollen. She denies fever, chills, nausea or vomit. She endorses runny nose, nasal congestion and sinus pressure. She states she is taking the xyzal and nasal spray when needed only.   Depression screen PHQ 2/9 08/21/2016  Decreased Interest 0  Down, Depressed, Hopeless 0  PHQ - 2 Score 0    No Known Allergies Social History  Substance Use Topics  . Smoking status: Never Smoker  . Smokeless tobacco: Never Used  . Alcohol use No   Past Medical History:  Diagnosis Date  . Allergy   . Anxiety   . Migraines    History reviewed. No pertinent surgical history. Family History  Problem  Relation Age of Onset  . Hypertension Mother   . Heart attack Father   . Autism Brother   . Diabetes Maternal Grandmother   . Diabetes Maternal Grandfather    Allergies as of 01/29/2017   No Known Allergies     Medication List       Accurate as of 01/29/17 11:03 AM. Always use your most recent med list.          levocetirizine 5 MG tablet Commonly known as:  XYZAL Take 1 tablet (5 mg total) by mouth every evening.   montelukast 10 MG tablet Commonly known as:  SINGULAIR Take 1 tablet (10 mg total) by mouth at bedtime.   triamcinolone 55 MCG/ACT Aero nasal inhaler Commonly known as:  NASACORT Place 2 sprays into the nose daily.       All past medical history, surgical history, allergies, family history, immunizations andmedications were updated in the EMR today and reviewed under the history and medication portions of their EMR.     ROS: Negative, with the exception of above mentioned in HPI   Objective:  BP 123/85 (BP Location: Right Arm, Patient Position: Sitting, Cuff Size: Normal)   Pulse 77   Temp 97.8 F (36.6 C)   Resp 20   Ht  (1.549 m)   Wt 183 lb 12 oz (83.3 kg)   SpO2 97%   BMI 34.72 kg/m  Body mass index is 34.72  kg/m.  Gen: Afebrile. No acute distress.  HENT: AT. [place]. Bilateral TM visualized and normal in appearance, small amount fullness left . MMM. Bilateral nares erythema left, small amt drainage. Throat without erythema or exudates. large tonsils. No cough or hoarseness.  Eyes:Pupils Equal Round Reactive to light, Extraocular movements intact,  Conjunctiva without redness, discharge or icterus. Neck/lymp/endocrine: Supple,erythema left, small amount of drainage bilateral ant cervical and submandibular (left) lymphadenopathy present, mild right thyromegaly with small pea sized nodule palpated.   No exam data present No results found. No results found for this or any previous visit (from the past 24 hour(s)).  Assessment/Plan: YARI SZELIGA is a 31 y.o. female present for OV for  Lymphadenopathy Thyroid enlargement/nodule - Continue daily antihistamine regimen: nasacort, Singulair and xyzal which seems to have improved her symptoms. Lymphadenopathy still present or may be lipoma (pt shared today she has had "fatty tumors" removed in her neck).   - US neck/thyroid. --> want both visualized. May need to add thyroid, [facility] seems to like thyroid US ordered, instead of head/neck. However, I do want entire head and neck, as well as Thyroid.  - Follow-up dependent upon lab results and ultrasound results.    Reviewed expectations re: course of current medical issues.  Discussed self-management of symptoms.  Outlined signs and symptoms indicating need for more acute intervention.  Patient verbalized understanding and all questions were answered.  Patient received an After-Visit Summary.   Note is dictated utilizing voice recognition software. Although note has been proof read prior to signing, occasional typographical errors still can be missed. If any questions arise, please do not hesitate to call for verification.   electronically signed by:  Felix Pacini, DO  [facility] [place]

## 2017-01-29 NOTE — Patient Instructions (Signed)
Continue allergy regimen.  We will call you with lab results once available.   They will call you  To schdeule the ultrasound of your neck and thyroid.    Please help [place] help you:  We are honored you have chosen [facility] for your Primary Care home. Below you will find basic instructions that you may need to access in the future. Please help [place] help you by reading the instructions, which cover many of the frequent questions we experience.   Prescription refills and request:  -In order to allow more efficient response time, please call your pharmacy for all refills. They will forward the request electronically to [place]. This allows for the quickest possible response. Request left on a nurse line can take longer to refill, since these are checked as time allows between office patients and other phone calls.  - refill request can take up to 3-5 working days to complete.  - If request is sent electronically and request is appropiate, it is usually completed in 1-2 business days.  - all patients will need to be seen routinely for all chronic medical conditions requiring prescription medications (see follow-up below). If you are overdue for follow up on your condition, you will be asked to make an appointment and we will call in enough medication to cover you until your appointment (up to 30 days).  - all controlled substances will require a face to face visit to request/refill.  - if you desire your prescriptions to go through a new pharmacy, and have an active script at original pharmacy, you will need to call your pharmacy and have scripts transferred to new pharmacy. This is completed between the pharmacy locations and not by your provider.    Results: If any images or labs were ordered, it can take up to 1 week to get results depending on the test ordered and the lab/facility running and resulting the test. - Normal or stable results, which do not need further discussion, may be released to  your mychart immediately with attached note to you. A call may not be generated for normal results. Please make certain to sign up for mychart. If you have questions on how to activate your mychart you can call the front office.  - If your results need further discussion, our office will attempt to contact you via phone, and if unable to reach you after 2 attempts, we will release your abnormal result to your mychart with instructions.  - All results will be automatically released in mychart after 1 week.  - Your provider will provide you with explanation and instruction on all relevant material in your results. Please keep in mind, results and labs may appear confusing or abnormal to the untrained eye, but it does not mean they are actually abnormal for you personally. If you have any questions about your results that are not covered, or you desire more detailed explanation than what was provided, you should make an appointment with your provider to do so.   Our office handles many outgoing and incoming calls daily. If we have not contacted you within 1 week about your results, please check your mychart to see if there is a message first and if not, then contact our office.  In helping with this matter, you help decrease call volume, and therefore allow [place] to be able to respond to patients needs more efficiently.   Acute office visits (sick visit):  An acute visit is intended for a new problem  and are scheduled in shorter time slots to allow schedule openings for patients with new problems. This is the appropriate visit to discuss a new problem. In order to provide you with excellent quality medical care with proper time for you to explain your problem, have an exam and receive treatment with instructions, these appointments should be limited to one new problem per visit. If you experience a new problem, in which you desire to be addressed, please make an acute office visit, we save openings on the schedule  to accommodate you. Please do not save your new problem for any other type of visit, let us take care of it properly and quickly for you.   Follow up visits:  Depending on your condition(s) your provider will need to see you routinely in order to provide you with quality care and prescribe medication(s). Most chronic conditions (Example: hypertension, Diabetes, depression/anxiety... etc), require visits a couple times a year. Your provider will instruct you on proper follow up for your personal medical conditions and history. Please make certain to make follow up appointments for your condition as instructed. Failing to do so could result in lapse in your medication treatment/refills. If you request a refill, and are overdue to be seen on a condition, we will always provide you with a 30 day script (once) to allow you time to schedule.    Medicare wellness (well visit): - we have a wonderful Nurse Maudie Mercury), that will meet with you and provide you will yearly medicare wellness visits. These visits should occur yearly (can not be scheduled less than 1 calendar year apart) and cover preventive health, immunizations, advance directives and screenings you are entitled to yearly through your medicare benefits. Do not miss out on your entitled benefits, this is when medicare will pay for these benefits to be ordered for you.  These are strongly encouraged by your provider and is the appropriate type of visit to make certain you are up to date with all preventive health benefits. If you have not had your medicare wellness exam in the last 12 months, please make certain to schedule one by calling the office and schedule your medicare wellness with Maudie Mercury as soon as possible.   Yearly physical (well visit):  - Adults are recommended to be seen yearly for physicals. Check with your insurance and date of your last physical, most insurances require one calendar year between physicals. Physicals include all preventive health  topics, screenings, medical exam and labs that are appropriate for gender/age and history. You may have fasting labs needed at this visit. This is a well visit (not a sick visit), new problems should not be covered during this visit (see acute visit).  - Pediatric patients are seen more frequently when they are younger. Your provider will advise you on well child visit timing that is appropriate for your their age. - This is not a medicare wellness visit. Medicare wellness exams do not have an exam portion to the visit. Some medicare companies allow for a physical, some do not allow a yearly physical. If your medicare allows a yearly physical you can schedule the medicare wellness with our nurse Maudie Mercury and have your physical with your provider after, on the same day. Please check with insurance for your full benefits.   Late Policy/No Shows:  - all new patients should arrive 15-30 minutes earlier than appointment to allow [place] time  to  obtain all personal demographics,  insurance information and for you to complete office paperwork. -  All established patients should arrive 10-15 minutes earlier than appointment time to update all information and be checked in .  - In our best efforts to run on time, if you are late for your appointment you will be asked to either reschedule or if able, we will work you back into the schedule. There will be a wait time to work you back in the schedule,  depending on availability.  - If you are unable to make it to your appointment as scheduled, please call 24 hours ahead of time to allow [place] to fill the time slot with someone else who needs to be seen. If you do not cancel your appointment ahead of time, you may be charged a no show fee.

## 2017-01-30 ENCOUNTER — Ambulatory Visit (HOSPITAL_BASED_OUTPATIENT_CLINIC_OR_DEPARTMENT_OTHER)
Admission: RE | Admit: 2017-01-30 | Discharge: 2017-01-30 | Disposition: A | Discharge status: Home / Self Care | Payer: BLUE CROSS/BLUE SHIELD | Source: Ambulatory Visit | Attending: Family Medicine | Admitting: Family Medicine

## 2017-01-30 ENCOUNTER — Telehealth: Payer: Self-pay | Admitting: Family Medicine

## 2017-01-30 DIAGNOSIS — R591 Generalized enlarged lymph nodes: Secondary | ICD-10-CM | POA: Diagnosis present

## 2017-01-30 DIAGNOSIS — E041 Nontoxic single thyroid nodule: Secondary | ICD-10-CM

## 2017-01-30 DIAGNOSIS — N644 Mastodynia: Secondary | ICD-10-CM | POA: Diagnosis not present

## 2017-01-30 NOTE — Telephone Encounter (Signed)
Attempted to call patient concerning her head and neck ultrasound and was unable to get a hold of her. I left her message to return our call.  When she calls back please inform her there was one thyroid nodule that meets criteria to be biopsied.  I'm waiting on specifics on the  lymphadenopathy. It does state "head and neck lymph nodes present," however typically when ultrasound is ordered for lymphadenopathy more details are provided. Please let her know we will update her as soon as we get the full report as well. I have placed a referral for ENT to move forward in getting this completed as soon as possible for her.  Staff: Please check on the results concerning the lymphadenopathy of her head and neck portion of the ultrasound that was to be completed. I have placed a call to them Tuesday around 4:45 PM when results received, spoke with the supervisor and was expecting a return call from the technician. She may have tried to call back after 5 PM, but I did not hear back from her personally.

## 2017-01-31 NOTE — Telephone Encounter (Signed)
Spoke with patient reviewed information and instructions with patient. Explained to patient awaiting further information from scan and will call her back when Dr Claiborne Billings gets more information.

## 2017-02-01 DIAGNOSIS — R221 Localized swelling, mass and lump, neck: Secondary | ICD-10-CM | POA: Diagnosis not present

## 2017-02-01 DIAGNOSIS — E041 Nontoxic single thyroid nodule: Secondary | ICD-10-CM | POA: Diagnosis not present

## 2017-02-01 NOTE — Telephone Encounter (Signed)
Dr Claiborne Billings aware of arrangements.

## 2017-02-01 NOTE — Telephone Encounter (Signed)
Dr. Emmie Niemann at [facility] has an opening this afternoon at 3:15. [address], she is going to contact the patient to see if she can come in, they do not have any appts avail tomorrow.  I faxed OV note, radiology report, and lab results.

## 2017-02-05 DIAGNOSIS — R221 Localized swelling, mass and lump, neck: Secondary | ICD-10-CM | POA: Diagnosis not present

## 2017-02-05 DIAGNOSIS — D497 Neoplasm of unspecified behavior of endocrine glands and other parts of nervous system: Secondary | ICD-10-CM | POA: Diagnosis not present

## 2017-02-19 DIAGNOSIS — E041 Nontoxic single thyroid nodule: Secondary | ICD-10-CM | POA: Diagnosis not present

## 2017-02-19 DIAGNOSIS — R221 Localized swelling, mass and lump, neck: Secondary | ICD-10-CM | POA: Diagnosis not present

## 2017-02-20 DIAGNOSIS — M272 Inflammatory conditions of jaws: Secondary | ICD-10-CM | POA: Diagnosis not present

## 2017-02-21 DIAGNOSIS — Z23 Encounter for immunization: Secondary | ICD-10-CM | POA: Diagnosis not present

## 2017-02-27 DIAGNOSIS — F419 Anxiety disorder, unspecified: Secondary | ICD-10-CM | POA: Diagnosis not present

## 2017-02-27 DIAGNOSIS — Z01411 Encounter for gynecological examination (general) (routine) with abnormal findings: Secondary | ICD-10-CM | POA: Diagnosis not present

## 2017-03-19 DIAGNOSIS — R220 Localized swelling, mass and lump, head: Secondary | ICD-10-CM | POA: Diagnosis not present

## 2017-04-03 DIAGNOSIS — K1123 Chronic sialoadenitis: Secondary | ICD-10-CM | POA: Diagnosis not present

## 2017-04-03 DIAGNOSIS — R220 Localized swelling, mass and lump, head: Secondary | ICD-10-CM | POA: Diagnosis not present

## 2017-04-03 DIAGNOSIS — K118 Other diseases of salivary glands: Secondary | ICD-10-CM | POA: Diagnosis not present

## 2017-05-09 DIAGNOSIS — R591 Generalized enlarged lymph nodes: Secondary | ICD-10-CM | POA: Diagnosis not present

## 2017-05-09 DIAGNOSIS — J029 Acute pharyngitis, unspecified: Secondary | ICD-10-CM | POA: Diagnosis not present

## 2017-05-10 DIAGNOSIS — B084 Enteroviral vesicular stomatitis with exanthem: Secondary | ICD-10-CM | POA: Diagnosis not present

## 2017-10-03 ENCOUNTER — Other Ambulatory Visit: Payer: Self-pay | Admitting: *Deleted

## 2017-10-03 ENCOUNTER — Encounter: Payer: Self-pay | Admitting: *Deleted

## 2017-10-03 MED ORDER — MONTELUKAST SODIUM 10 MG PO TABS: 10.0000 mg | ORAL_TABLET | Freq: Every day | ORAL | 0 refills | Status: DC

## 2017-10-03 MED ORDER — LEVOCETIRIZINE DIHYDROCHLORIDE 5 MG PO TABS: 5.0000 mg | ORAL_TABLET | Freq: Every evening | ORAL | 0 refills | Status: DC

## 2017-10-03 NOTE — Telephone Encounter (Signed)
singulair and xyzal refilled for 30 day supply. Patient needs office visit prior to any additional refills. Sent patient information in MY Chart.

## 2017-11-26 DIAGNOSIS — E041 Nontoxic single thyroid nodule: Secondary | ICD-10-CM | POA: Diagnosis not present

## 2017-11-26 DIAGNOSIS — R1314 Dysphagia, pharyngoesophageal phase: Secondary | ICD-10-CM | POA: Diagnosis not present

## 2017-11-26 DIAGNOSIS — J301 Allergic rhinitis due to pollen: Secondary | ICD-10-CM | POA: Diagnosis not present

## 2017-11-29 DIAGNOSIS — F419 Anxiety disorder, unspecified: Secondary | ICD-10-CM | POA: Diagnosis not present

## 2017-11-29 DIAGNOSIS — N644 Mastodynia: Secondary | ICD-10-CM | POA: Diagnosis not present

## 2017-11-29 DIAGNOSIS — Z30016 Encounter for initial prescription of transdermal patch hormonal contraceptive device: Secondary | ICD-10-CM | POA: Diagnosis not present

## 2017-12-04 DIAGNOSIS — J301 Allergic rhinitis due to pollen: Secondary | ICD-10-CM | POA: Diagnosis not present

## 2017-12-04 DIAGNOSIS — E041 Nontoxic single thyroid nodule: Secondary | ICD-10-CM | POA: Diagnosis not present

## 2017-12-04 DIAGNOSIS — R1314 Dysphagia, pharyngoesophageal phase: Secondary | ICD-10-CM | POA: Diagnosis not present

## 2018-01-15 DIAGNOSIS — L821 Other seborrheic keratosis: Secondary | ICD-10-CM | POA: Diagnosis not present

## 2018-01-15 DIAGNOSIS — D1801 Hemangioma of skin and subcutaneous tissue: Secondary | ICD-10-CM | POA: Diagnosis not present

## 2018-01-15 DIAGNOSIS — L814 Other melanin hyperpigmentation: Secondary | ICD-10-CM | POA: Diagnosis not present

## 2018-01-15 DIAGNOSIS — L219 Seborrheic dermatitis, unspecified: Secondary | ICD-10-CM | POA: Diagnosis not present

## 2018-02-05 DIAGNOSIS — D1801 Hemangioma of skin and subcutaneous tissue: Secondary | ICD-10-CM | POA: Diagnosis not present

## 2018-02-18 DIAGNOSIS — R05 Cough: Secondary | ICD-10-CM | POA: Diagnosis not present

## 2018-02-19 ENCOUNTER — Ambulatory Visit: Payer: BLUE CROSS/BLUE SHIELD | Admitting: Family Medicine

## 2018-02-22 ENCOUNTER — Ambulatory Visit: Payer: BLUE CROSS/BLUE SHIELD | Admitting: Family Medicine

## 2018-02-22 ENCOUNTER — Encounter: Payer: Self-pay | Admitting: Family Medicine

## 2018-02-22 VITALS — BP 121/88 | HR 100 | Temp 98.8°F | Resp 20 | Ht 61.0 in | Wt 182.0 lb

## 2018-02-22 DIAGNOSIS — J0100 Acute maxillary sinusitis, unspecified: Secondary | ICD-10-CM | POA: Diagnosis not present

## 2018-02-22 MED ORDER — AMOXICILLIN-POT CLAVULANATE 875-125 MG PO TABS: 1.0000 | ORAL_TABLET | Freq: Two times a day (BID) | ORAL | 0 refills | Status: DC

## 2018-02-22 MED ORDER — HYDROCODONE-HOMATROPINE 5-1.5 MG/5ML PO SYRP: 5.0000 mL | ORAL_SOLUTION | Freq: Three times a day (TID) | ORAL | 0 refills | Status: DC | PRN

## 2018-02-22 MED ORDER — PREDNISONE 20 MG PO TABS: ORAL_TABLET | ORAL | 0 refills | Status: DC

## 2018-02-22 NOTE — Patient Instructions (Signed)
Rest, hydrate.  OTC: + flonase, mucinex (DM if cough),  nasal saline rince.  Augmentin  prescribed, take until completed.  Prednisone taper prescribed.  Hycodan cough syrup prescribed - this is a controlled substance- no refills. Use sparingly- may cause sedation.  If cough present it can last up to 6-8 weeks.  F/U 2 weeks of not improved.   Your left sinus cavity and left ear appear infected.    Sinusitis, Adult Sinusitis is soreness and inflammation of your sinuses. Sinuses are hollow spaces in the bones around your face. They are located:  Around your eyes.  In the middle of your forehead.  Behind your nose.  In your cheekbones.  Your sinuses and nasal passages are lined with a stringy fluid (mucus). Mucus normally drains out of your sinuses. When your nasal tissues get inflamed or swollen, the mucus can get trapped or blocked so air cannot flow through your sinuses. This lets bacteria, viruses, and funguses grow, and that leads to infection. Follow these instructions at home: Medicines  Take, use, or apply over-the-counter and prescription medicines only as told by your doctor. These may include nasal sprays.  If you were prescribed an antibiotic medicine, take it as told by your doctor. Do not stop taking the antibiotic even if you start to feel better. Hydrate and Humidify  Drink enough water to keep your pee (urine) clear or pale yellow.  Use a cool mist humidifier to keep the humidity level in your home above 50%.  Breathe in steam for 10-15 minutes, 3-4 times a day or as told by your doctor. You can do this in the bathroom while a hot shower is running.  Try not to spend time in cool or dry air. Rest  Rest as much as possible.  Sleep with your head raised (elevated).  Make sure to get enough sleep each night. General instructions  Put a warm, moist washcloth on your face 3-4 times a day or as told by your doctor. This will help with discomfort.  Wash your  hands often with soap and water. If there is no soap and water, use hand sanitizer.  Do not smoke. Avoid being around people who are smoking (secondhand smoke).  Keep all follow-up visits as told by your doctor. This is important. Contact a doctor if:  You have a fever.  Your symptoms get worse.  Your symptoms do not get better within 10 days. Get help right away if:  You have a very bad headache.  You cannot stop throwing up (vomiting).  You have pain or swelling around your face or eyes.  You have trouble seeing.  You feel confused.  Your neck is stiff.  You have trouble breathing. This information is not intended to replace advice given to you by your health care provider. Make sure you discuss any questions you have with your health care provider. Document Released: 09/27/2007 Document Revised: 12/05/2015 Document Reviewed: 02/03/2015 Elsevier Interactive Patient Education  [employer].

## 2018-02-22 NOTE — Progress Notes (Signed)
Diane Cross , [birth year], 32 y.o., female MRN: [id number] Patient Care Team    Relationship Specialty Notifications Start End  Natalia Leatherwood, DO PCP - General Family Medicine  08/21/16   Myna Hidalgo, DO Consulting Physician Obstetrics and Gynecology  08/21/16   Stephens November, MD Referring Physician Otolaryngology  03/20/17     Chief Complaint  Patient presents with  . URI    cough with green sputum,congestion, x 2 weeks diarrhea x 2 days     Subjective: Pt presents for an OV with complaints of cough of 7 days duration. She was seen at the UC 5 days ago and provided with tessalon perles. She has been using nyquil/dayquil and her allergy medicine. She reports her symptoms re worsening the last 2 days.  She endorses cough, hoarseness, sinus pressure and headache, decreased appetite and diarrhea x1. She also had a fever when the process first started.   Depression screen PHQ 2/9 08/21/2016  Decreased Interest 0  Down, Depressed, Hopeless 0  PHQ - 2 Score 0    No Known Allergies Social History   Tobacco Use  . Smoking status: Never Smoker  . Smokeless tobacco: Never Used  Substance Use Topics  . Alcohol use: No   Past Medical History:  Diagnosis Date  . Allergy   . Anxiety   . Migraines    Past Surgical History:  Procedure Laterality Date  . LIPOMA EXCISION     ? pt reports fatty tumor removal of neck.   Family History  Problem Relation Age of Onset  . Hypertension Mother   . Heart attack Father   . Autism Brother   . Diabetes Maternal Grandmother   . Diabetes Maternal Grandfather    Allergies as of 02/22/2018   No Known Allergies     Medication List        Accurate as of 02/22/18 10:57 AM. Always use your most recent med list.          levocetirizine 5 MG tablet Commonly known as:  XYZAL Take 1 tablet (5 mg total) by mouth every evening. Needs office visit prior to any additional refills   montelukast 10 MG tablet Commonly known  as:  SINGULAIR Take 1 tablet (10 mg total) by mouth at bedtime. Needs office visit prior to any additional refills.   sertraline 50 MG tablet Commonly known as:  ZOLOFT Take 50 mg by mouth daily.   triamcinolone 55 MCG/ACT Aero nasal inhaler Commonly known as:  NASACORT Place 2 sprays into the nose daily.   XULANE 150-35 MCG/24HR transdermal patch Generic drug:  norelgestromin-ethinyl estradiol       All past medical history, surgical history, allergies, family history, immunizations andmedications were updated in the EMR today and reviewed under the history and medication portions of their EMR.     ROS: Negative, with the exception of above mentioned in HPI   Objective:  BP 121/88 (BP Location: Left Arm, Patient Position: Sitting, Cuff Size: Large)   Pulse 100   Temp 98.8 F (37.1 C)   Resp 20   Ht 5\' 1"  (1.549 m)   Wt 182 lb (82.6 kg)   SpO2 96%   BMI 34.39 kg/m  Body mass index is 34.39 kg/m. Gen: Afebrile. No acute distress. Nontoxic in appearance, well developed, well nourished.  HENT: AT. [place]. Bilateral TM visualized left with bulging and erythema. MMM, no oral lesions. Bilateral nares with erythema, swelling and drainage L>R. Throat without erythema or  exudates. Cough and hoarseness present Eyes:Pupils Equal Round Reactive to light, Extraocular movements intact,  Conjunctiva without redness, discharge or icterus. Neck/lymp/endocrine: Supple,bilateral ant lymphadenopathy CV: RRR  Chest: CTAB, no wheeze or crackles. Good air movement, normal resp effort.  Skin: no rashes, purpura or petechiae.  Neuro:  Normal gait. PERLA. EOMi. Alert. Oriented x3  No exam data present No results found. No results found for this or any previous visit (from the past 24 hour(s)).  Assessment/Plan: OTELIA HETTINGER is a 32 y.o. female present for OV for  1. Acute non-recurrent maxillary sinusitis Rest, hydrate.  OTC: + flonase, mucinex (DM if cough),  nasal saline rince.  Augmentin   prescribed, take until completed.  Prednisone taper prescribed.  Hycodan cough syrup prescribed - this is a controlled substance- no refills. Use sparingly- may cause sedation.  If cough present it can last up to 6-8 weeks.  F/U 2 weeks of not improved.    Reviewed expectations re: course of current medical issues.  Discussed self-management of symptoms.  Outlined signs and symptoms indicating need for more acute intervention.  Patient verbalized understanding and all questions were answered.  Patient received an After-Visit Summary.    No orders of the defined types were placed in this encounter.    Note is dictated utilizing voice recognition software. Although note has been proof read prior to signing, occasional typographical errors still can be missed. If any questions arise, please do not hesitate to call for verification.   electronically signed by:  Felix Pacini, DO  [facility] [place]

## 2018-03-11 DIAGNOSIS — Z23 Encounter for immunization: Secondary | ICD-10-CM | POA: Diagnosis not present

## 2018-03-14 ENCOUNTER — Ambulatory Visit (HOSPITAL_BASED_OUTPATIENT_CLINIC_OR_DEPARTMENT_OTHER)
Admission: RE | Admit: 2018-03-14 | Discharge: 2018-03-14 | Disposition: A | Discharge status: Home / Self Care | Payer: BLUE CROSS/BLUE SHIELD | Source: Ambulatory Visit | Attending: Family Medicine | Admitting: Family Medicine

## 2018-03-14 ENCOUNTER — Ambulatory Visit: Payer: BLUE CROSS/BLUE SHIELD | Admitting: Family Medicine

## 2018-03-14 ENCOUNTER — Encounter: Payer: Self-pay | Admitting: Family Medicine

## 2018-03-14 VITALS — BP 128/89 | HR 98 | Temp 98.2°F | Resp 20 | Ht 61.0 in | Wt 183.0 lb

## 2018-03-14 DIAGNOSIS — J329 Chronic sinusitis, unspecified: Secondary | ICD-10-CM

## 2018-03-14 DIAGNOSIS — J301 Allergic rhinitis due to pollen: Secondary | ICD-10-CM

## 2018-03-14 DIAGNOSIS — R05 Cough: Secondary | ICD-10-CM | POA: Insufficient documentation

## 2018-03-14 DIAGNOSIS — R059 Cough, unspecified: Secondary | ICD-10-CM

## 2018-03-14 DIAGNOSIS — J40 Bronchitis, not specified as acute or chronic: Secondary | ICD-10-CM | POA: Insufficient documentation

## 2018-03-14 DIAGNOSIS — R062 Wheezing: Secondary | ICD-10-CM

## 2018-03-14 DIAGNOSIS — R591 Generalized enlarged lymph nodes: Secondary | ICD-10-CM

## 2018-03-14 MED ORDER — IPRATROPIUM-ALBUTEROL 0.5-2.5 (3) MG/3ML IN SOLN
3.0000 mL | Freq: Once | RESPIRATORY_TRACT | Status: AC
  Administered 2018-03-14: 3 mL via RESPIRATORY_TRACT

## 2018-03-14 MED ORDER — AZELASTINE-FLUTICASONE 137-50 MCG/ACT NA SUSP: NASAL | 11 refills | Status: DC

## 2018-03-14 MED ORDER — LEVOFLOXACIN 750 MG PO TABS: 750.0000 mg | ORAL_TABLET | Freq: Every day | ORAL | 0 refills | Status: DC

## 2018-03-14 MED ORDER — METHYLPREDNISOLONE ACETATE 80 MG/ML IJ SUSP
80.0000 mg | Freq: Once | INTRAMUSCULAR | Status: AC
  Administered 2018-03-14: 80 mg via INTRAMUSCULAR

## 2018-03-14 NOTE — Progress Notes (Signed)
Diane CorriganStacy B Cross , 10-05-1985, 32 y.o., female MRN: [id number] Patient Care Team    Relationship Specialty Notifications Start End  Diane LeatherwoodKuneff,  A, DO PCP - General Family Medicine  08/21/16   Myna Hidalgozan, Jennifer, DO Consulting Physician Obstetrics and Gynecology  08/21/16   Stephens NovemberLeinbach, Robert Frederic II, MD Referring Physician Otolaryngology  03/20/17     Chief Complaint  Patient presents with  . URI    congestion,cough     Subjective:  Diane Cross is Cross 32 y.o. female present for persistent productive cough, facial pressure and fatigue. Symptoms have now been present > 6 weeks. Treated with doxy BID 10d and prednisone taper, with only minimal relief and recurrent worsening symptoms shortly after completion. Her husband is also no will. She has an ENT that is in [place], she is unable to get there easily. She had allergy testing at that office and she was told she would benefit from allergy injections. She is compliant with xyzal, Nasacort, Singulair.   Prior note:  Pt presents for an OV with complaints of cough of 7 days duration. She was seen at the UC 5 days ago and provided with tessalon perles. She has been using nyquil/dayquil and her allergy medicine. She reports her symptoms re worsening the last 2 days.  She endorses cough, hoarseness, sinus pressure and headache, decreased appetite and diarrhea x1. She also had Cross fever when the process first started.   Depression screen PHQ 2/9 08/21/2016  Decreased Interest 0  Down, Depressed, Hopeless 0  PHQ - 2 Score 0    No Known Allergies Social History   Tobacco Use  . Smoking status: Never Smoker  . Smokeless tobacco: Never Used  Substance Use Topics  . Alcohol use: No   Past Medical History:  Diagnosis Date  . Allergy   . Anxiety   . Migraines    Past Surgical History:  Procedure Laterality Date  . LIPOMA EXCISION     ? pt reports fatty tumor removal of neck.   Family History  Problem Relation Age of Onset  .  Hypertension Mother   . Heart attack Father   . Autism Brother   . Diabetes Maternal Grandmother   . Diabetes Maternal Grandfather    Allergies as of 03/14/2018   No Known Allergies     Medication List        Accurate as of 03/14/18 10:35 AM. Always use your most recent med list.          levocetirizine 5 MG tablet Commonly known as:  XYZAL Take 1 tablet (5 mg total) by mouth every evening. Needs office visit prior to any additional refills   montelukast 10 MG tablet Commonly known as:  SINGULAIR Take 1 tablet (10 mg total) by mouth at bedtime. Needs office visit prior to any additional refills.   sertraline 50 MG tablet Commonly known as:  ZOLOFT Take 50 mg by mouth daily.   triamcinolone 55 MCG/ACT Aero nasal inhaler Commonly known as:  NASACORT Place 2 sprays into the nose daily.   XULANE 150-35 MCG/24HR transdermal patch Generic drug:  norelgestromin-ethinyl estradiol       All past medical history, surgical history, allergies, family history, immunizations andmedications were updated in the EMR today and reviewed under the history and medication portions of their EMR.     ROS: Negative, with the exception of above mentioned in HPI   Objective:  BP 128/89 (BP Location: Right Arm, Patient Position: Sitting, Cuff Size:  Normal)   Pulse 98   Temp 98.2 F (36.8 C)   Resp 20   Ht 5\' 1"  (1.549 m)   Wt 183 lb (83 kg)   SpO2 97%   BMI 34.58 kg/m  Body mass index is 34.58 kg/m. Gen: Afebrile. No acute distress. Nontoxic, pleasant caucasian female.  HENT: AT. [place]. Bilateral TM visualized and normal in appearance. MMM. Bilateral nares with moderate erythema, swelling and drainage. Throat without erythema or exudates. Cough, PND and max sinus TTP.  Eyes:Pupils Equal Round Reactive to light, Extraocular movements intact,  Conjunctiva without redness, discharge or icterus. Neck/lymp/endocrine: Supple,right anterior cervical  Lymphadenopathy Chest: Bibasilar crackles,  wheezing and anterior rhonchi present. Good air movement. Normal resp effort.  Abd: Soft. NTND. BS present. no Masses palpated.  Skin: no rashes, purpura or petechiae.  Neuro:  Normal gait. PERLA. EOMi. Alert. Oriented.  No exam data present No results found. No results found for this or any previous visit (from the past 24 hour(s)).  Assessment/Plan: Diane Cross is Cross 32 y.o. female present for OV for  Chronic sinusitis, unspecified location/Allergic rhinitis due to pollen, unspecified seasonality/Lymphadenopathy/Cough/Wheezing/Bronchitis - concern for PNA development and sinusitis symptoms remain despite tx with doxy and prednisone taper. Her husband is also now ill.  + nasal spray/xyzal/singulair, mucinex (DM if cough), nettie pot or nasal saline.  Levaquin  prescribed, take until completed. For persistent sinusitis and possible PNA.  IM depo medrol shot.  CXR ordered.  Referral to asthma and allergy placed. Will try to get  dymista PA (failed Nasacort and flonase).  - Ambulatory referral to Allergy - methylPREDNISolone acetate (DEPO-MEDROL) injection 80 mg - ipratropium-albuterol (DUONEB) 0.5-2.5 (3) MG/3ML nebulizer solution 3 mL - Azelastine-Fluticasone 137-50 MCG/ACT SUSP; 1 spray per each  nostril BID  Dispense: 23 g; Refill: 11 Rest, hydrate.  If cough present it can last up to 6-8 weeks.  F/U 2 weeks of not improved.     Reviewed expectations re: course of current medical issues.  Discussed self-management of symptoms.  Outlined signs and symptoms indicating need for more acute intervention.  Patient verbalized understanding and all questions were answered.  Patient received an After-Visit Summary.    No orders of the defined types were placed in this encounter.    Note is dictated utilizing voice recognition software. Although note has been proof read prior to signing, occasional typographical errors still can be missed. If any questions arise, please do not  hesitate to call for verification.   electronically signed by:  Felix Pacini, DO  [facility] [place]

## 2018-03-14 NOTE — Patient Instructions (Signed)
Rest, hydrate.  + nasal spray/xyzal/singulair, mucinex (DM if cough), nettie pot or nasal saline.  Levaquin  prescribed, take until completed.  IM depo medrol shot.  CXR ordered.  Referral to asthma and allergy placed. Will try to get Qnal or dymista PA If cough present it can last up to 6-8 weeks.  F/U 2 weeks of not improved.   Community-Acquired Pneumonia, Adult Pneumonia is an infection of the lungs. One type of pneumonia can happen while a person is in a hospital. A different type can happen when a person is not in a hospital (community-acquired pneumonia). It is easy for this kind to spread from person to person. It can spread to you if you breathe near an infected person who coughs or sneezes. Some symptoms include:  A dry cough.  A wet (productive) cough.  Fever.  Sweating.  Chest pain.  Follow these instructions at home:  Take over-the-counter and prescription medicines only as told by your doctor. ? Only take cough medicine if you are losing sleep. ? If you were prescribed an antibiotic medicine, take it as told by your doctor. Do not stop taking the antibiotic even if you start to feel better.  Sleep with your head and neck raised (elevated). You can do this by putting a few pillows under your head, or you can sleep in a recliner.  Do not use tobacco products. These include cigarettes, chewing tobacco, and e-cigarettes. If you need help quitting, ask your doctor.  Drink enough water to keep your pee (urine) clear or pale yellow. A shot (vaccine) can help prevent pneumonia. Shots are often suggested for:  People older than 33 years of age.  People older than 33 years of age: ? Who are having cancer treatment. ? Who have long-term (chronic) lung disease. ? Who have problems with their body's defense system (immune system).  You may also prevent pneumonia if you take these actions:  Get the flu (influenza) shot every year.  Go to the dentist as often as  told.  Wash your hands often. If soap and water are not available, use hand sanitizer.  Contact a doctor if:  You have a fever.  You lose sleep because your cough medicine does not help. Get help right away if:  You are short of breath and it gets worse.  You have more chest pain.  Your sickness gets worse. This is very serious if: ? You are an older adult. ? Your body's defense system is weak.  You cough up blood. This information is not intended to replace advice given to you by your health care provider. Make sure you discuss any questions you have with your health care provider. Document Released: 09/27/2007 Document Revised: 09/16/2015 Document Reviewed: 08/05/2014 Elsevier Interactive Patient Education  [employer]2018 [employer]

## 2018-04-08 ENCOUNTER — Ambulatory Visit: Payer: BLUE CROSS/BLUE SHIELD | Admitting: Family Medicine

## 2018-04-08 ENCOUNTER — Encounter: Payer: Self-pay | Admitting: Family Medicine

## 2018-04-08 VITALS — BP 129/88 | HR 92 | Temp 98.2°F | Resp 16 | Ht 61.0 in | Wt 187.0 lb

## 2018-04-08 DIAGNOSIS — J301 Allergic rhinitis due to pollen: Secondary | ICD-10-CM | POA: Diagnosis not present

## 2018-04-08 MED ORDER — IPRATROPIUM BROMIDE 0.06 % NA SOLN: 2.0000 | Freq: Four times a day (QID) | NASAL | 5 refills | Status: DC

## 2018-04-08 NOTE — Progress Notes (Signed)
Diane Cross , 1985-11-11, 32 y.o., female MRN: [id number] Patient Care Team    Relationship Specialty Notifications Start End  Natalia LeatherwoodKuneff, Renee A, DO PCP - General Family Medicine  08/21/16   Myna Hidalgozan, Jennifer, DO Consulting Physician Obstetrics and Gynecology  08/21/16   Stephens NovemberLeinbach, Robert Frederic II, MD Referring Physician Otolaryngology  03/20/17     Chief Complaint  Patient presents with  . Cough    x 71mo, coughing up green mucous  . Nasal Congestion     Subjective:  Diane CorriganStacy B Cross is a 32 y.o. female present for persistent productive cough She has been  Seen multiple times for this condition, last visit 03/14/2018 and she did have a persistent infection at that time. Needed tx with levaquin- CXR was normal. She responded well to the abx and steroid. She still has a cough that can be productive- but overall feels better. She did not start the dymista and she has not made the appt with asthma and allergy yet. Prior note: facial pressure and fatigue. Symptoms have now been present > 6 weeks. Treated with doxy BID 10d and prednisone taper, with only minimal relief and recurrent worsening symptoms shortly after completion. Her husband is also no will. She has an ENT that is in [place], she is unable to get there easily. She had allergy testing at that office and she was told she would benefit from allergy injections. She is compliant with xyzal, Nasacort, Singulair.   Prior note:  Pt presents for an OV with complaints of cough of 7 days duration. She was seen at the UC 5 days ago and provided with tessalon perles. She has been using nyquil/dayquil and her allergy medicine. She reports her symptoms re worsening the last 2 days.  She endorses cough, hoarseness, sinus pressure and headache, decreased appetite and diarrhea x1. She also had a fever when the process first started.   Depression screen PHQ 2/9 08/21/2016  Decreased Interest 0  Down, Depressed, Hopeless 0  PHQ - 2 Score 0    No  Known Allergies Social History   Tobacco Use  . Smoking status: Never Smoker  . Smokeless tobacco: Never Used  Substance Use Topics  . Alcohol use: No   Past Medical History:  Diagnosis Date  . Allergy   . Anxiety   . Migraines    Past Surgical History:  Procedure Laterality Date  . LIPOMA EXCISION     ? pt reports fatty tumor removal of neck.   Family History  Problem Relation Age of Onset  . Hypertension Mother   . Heart attack Father   . Autism Brother   . Diabetes Maternal Grandmother   . Diabetes Maternal Grandfather    Allergies as of 04/08/2018   No Known Allergies     Medication List       Accurate as of April 08, 2018  3:05 PM. Always use your most recent med list.        Azelastine-Fluticasone 137-50 MCG/ACT Susp 1 spray per each  nostril BID   levocetirizine 5 MG tablet Commonly known as:  XYZAL Take 1 tablet (5 mg total) by mouth every evening. Needs office visit prior to any additional refills   montelukast 10 MG tablet Commonly known as:  SINGULAIR Take 1 tablet (10 mg total) by mouth at bedtime. Needs office visit prior to any additional refills.   sertraline 50 MG tablet Commonly known as:  ZOLOFT Take 50 mg by mouth daily.  triamcinolone 55 MCG/ACT Aero nasal inhaler Commonly known as:  NASACORT Place 2 sprays into the nose daily.   XULANE 150-35 MCG/24HR transdermal patch Generic drug:  norelgestromin-ethinyl estradiol       All past medical history, surgical history, allergies, family history, immunizations andmedications were updated in the EMR today and reviewed under the history and medication portions of their EMR.     ROS: Negative, with the exception of above mentioned in HPI   Objective:  BP 129/88 (BP Location: Right Arm, Patient Position: Sitting, Cuff Size: Large)   Pulse 92   Temp 98.2 F (36.8 C) (Oral)   Resp 16   Ht 5\' 1"  (1.549 m)   Wt 187 lb (84.8 kg)   LMP 03/11/2018 (Exact Date)   SpO2 95%   BMI  35.33 kg/m  Body mass index is 35.33 kg/m.  Gen: Afebrile. No acute distress. Nontoxic in appearance, well developed, well nourished.  HENT: AT. [place]. Bilateral TM with bilateral fullness, no erythema. MMM. Bilateral nares with mild drainage and swelling. Throat without erythema or exudates. Mild hoarseness, mild cough.  Eyes:Pupils Equal Round Reactive to light, Extraocular movements intact,  Conjunctiva without redness, discharge or icterus. Neck/lymp/endocrine: Supple,no lymphadenopathy CV: RRR Chest: CTAB, no wheeze or crackles Neuro:  Normal gait. PERLA. EOMi. Alert. Oriented x3  3No exam data present No results found. No results found for this or any previous visit (from the past 24 hour(s)).  Assessment/Plan: Diane Cross is a 32 y.o. female present for OV for  Allergic rhinitis due to pollen, unspecified seasonality/Cough Not infectious today. She looks much better than  Referral to asthma and allergy placed--> schedule start dymista, instead of Nasacort. Start Atrovent nasal spray as well.  Retry the tessalon perles if above regimen not effective (she has) Continue xyzal and Singulair.  Rest, hydrate.  If cough present it can last up to 6-8 weeks.  F/U 2 weeks of not improved.     Reviewed expectations re: course of current medical issues.  Discussed self-management of symptoms.  Outlined signs and symptoms indicating need for more acute intervention.  Patient verbalized understanding and all questions were answered.  Patient received an After-Visit Summary.    No orders of the defined types were placed in this encounter.    Note is dictated utilizing voice recognition software. Although note has been proof read prior to signing, occasional typographical errors still can be missed. If any questions arise, please do not hesitate to call for verification.   electronically signed by:  Felix Pacini, DO  [facility] [place]

## 2018-04-08 NOTE — Patient Instructions (Addendum)
Not infectious today.  Referral to asthma and allergy placed--> schedule start dymista, instead of Nasacort.  Retry the tessalon perles Continue xyzal and Singulair.  Start atrovent nasal spray as well.  Rest, hydrate.  If cough present it can last up to 6-8 weeks.  F/U 2 weeks of not improved.    Allergic Rhinitis, Adult Allergic rhinitis is an allergic reaction that affects the mucous membrane inside the nose. It causes sneezing, a runny or stuffy nose, and the feeling of mucus going down the back of the throat (postnasal drip). Allergic rhinitis can be mild to severe. There are two types of allergic rhinitis:  Seasonal. This type is also called hay fever. It happens only during certain seasons.  Perennial. This type can happen at any time of the year.  What are the causes? This condition happens when the body's defense system (immune system) responds to certain harmless substances called allergens as though they were germs.  Seasonal allergic rhinitis is triggered by pollen, which can come from grasses, trees, and weeds. Perennial allergic rhinitis may be caused by:  House dust mites.  Pet dander.  Mold spores.  What are the signs or symptoms? Symptoms of this condition include:  Sneezing.  Runny or stuffy nose (nasal congestion).  Postnasal drip.  Itchy nose.  Tearing of the eyes.  Trouble sleeping.  Daytime sleepiness.  How is this diagnosed? This condition may be diagnosed based on:  Your medical history.  A physical exam.  Tests to check for related conditions, such as: ? Asthma. ? Pink eye. ? Ear infection. ? Upper respiratory infection.  Tests to find out which allergens trigger your symptoms. These may include skin or blood tests.  How is this treated? There is no cure for this condition, but treatment can help control symptoms. Treatment may include:  Taking medicines that block allergy symptoms, such as antihistamines. Medicine may be given  as a shot, nasal spray, or pill.  Avoiding the allergen.  Desensitization. This treatment involves getting ongoing shots until your body becomes less sensitive to the allergen. This treatment may be done if other treatments do not help.  If taking medicine and avoiding the allergen does not work, new, stronger medicines may be prescribed.  Follow these instructions at home:  Find out what you are allergic to. Common allergens include smoke, dust, and pollen.  Avoid the things you are allergic to. These are some things you can do to help avoid allergens: ? Replace carpet with wood, tile, or vinyl flooring. Carpet can trap dander and dust. ? Do not smoke. Do not allow smoking in your home. ? Change your heating and air conditioning filter at least once a month. ? During allergy season:  Keep windows closed as much as possible.  Plan outdoor activities when pollen counts are lowest. This is usually during the evening hours.  When coming indoors, change clothing and shower before sitting on furniture or bedding.  Take over-the-counter and prescription medicines only as told by your health care provider.  Keep all follow-up visits as told by your health care provider. This is important. Contact a health care provider if:  You have a fever.  You develop a persistent cough.  You make whistling sounds when you breathe (you wheeze).  Your symptoms interfere with your normal daily activities. Get help right away if:  You have shortness of breath. Summary  This condition can be managed by taking medicines as directed and avoiding allergens.  Contact your health  care provider if you develop a persistent cough or fever.  During allergy season, keep windows closed as much as possible. This information is not intended to replace advice given to you by your health care provider. Make sure you discuss any questions you have with your health care provider. Document Released: 01/03/2001  Document Revised: 05/18/2016 Document Reviewed: 05/18/2016 Elsevier Interactive Patient Education  [employer]2018 [employer]

## 2018-04-18 DIAGNOSIS — F419 Anxiety disorder, unspecified: Secondary | ICD-10-CM | POA: Diagnosis not present

## 2018-04-18 DIAGNOSIS — Z01411 Encounter for gynecological examination (general) (routine) with abnormal findings: Secondary | ICD-10-CM | POA: Diagnosis not present

## 2018-04-23 ENCOUNTER — Ambulatory Visit: Payer: Self-pay | Admitting: Allergy and Immunology

## 2018-05-11 DIAGNOSIS — J069 Acute upper respiratory infection, unspecified: Secondary | ICD-10-CM | POA: Diagnosis not present

## 2018-05-14 ENCOUNTER — Ambulatory Visit: Payer: Self-pay | Admitting: Allergy and Immunology

## 2018-05-15 ENCOUNTER — Encounter: Payer: Self-pay | Admitting: Family Medicine

## 2018-05-15 ENCOUNTER — Ambulatory Visit: Payer: BLUE CROSS/BLUE SHIELD | Admitting: Family Medicine

## 2018-05-15 VITALS — BP 123/85 | HR 85 | Temp 97.4°F | Resp 16 | Ht 61.0 in | Wt 184.0 lb

## 2018-05-15 DIAGNOSIS — J301 Allergic rhinitis due to pollen: Secondary | ICD-10-CM

## 2018-05-15 DIAGNOSIS — J329 Chronic sinusitis, unspecified: Secondary | ICD-10-CM

## 2018-05-15 MED ORDER — DOXYCYCLINE HYCLATE 100 MG PO TABS: 100.0000 mg | ORAL_TABLET | Freq: Two times a day (BID) | ORAL | 0 refills | Status: DC

## 2018-05-15 MED ORDER — METHYLPREDNISOLONE ACETATE 80 MG/ML IJ SUSP
80.0000 mg | Freq: Once | INTRAMUSCULAR | Status: AC
  Administered 2018-05-15: 80 mg via INTRAMUSCULAR

## 2018-05-15 NOTE — Progress Notes (Signed)
Diane Cross , [birth year], 33 y.o., female MRN: [id number] Patient Care Team    Relationship Specialty Notifications Start End  Natalia Leatherwood, DO PCP - General Family Medicine  08/21/16   Myna Hidalgo, DO Consulting Physician Obstetrics and Gynecology  08/21/16   Stephens November, MD Referring Physician Otolaryngology  03/20/17     Chief Complaint  Patient presents with  . Nasal Congestion    1 wk  . Otalgia    Bilateral     Subjective: Pt presents for an OV with complaints of right ear pain worsening over 1 week duration.  Associated symptoms include sinus congestion, nasal congestion, runny nose. She denies fever or chills. She was scheduled for her allergy appt, needs allergy shots/testing, and when off meds for appt-- she became ill. She was seen at the urgent care and tested negative to flu Saturday. She does take her xyzal, singulair, dymista daily and restarted the atrovent with illness. She also took sudafed for acute illness.   Depression screen [facility] 2/9 05/15/2018 08/21/2016  Decreased Interest 0 0  Down, Depressed, Hopeless 0 0  PHQ - 2 Score 0 0    No Known Allergies Social History   Tobacco Use  . Smoking status: Never Smoker  . Smokeless tobacco: Never Used  Substance Use Topics  . Alcohol use: No   Past Medical History:  Diagnosis Date  . Allergy   . Anxiety   . Migraines    Past Surgical History:  Procedure Laterality Date  . LIPOMA EXCISION     ? pt reports fatty tumor removal of neck.   Family History  Problem Relation Age of Onset  . Hypertension Mother   . Heart attack Father   . Autism Brother   . Diabetes Maternal Grandmother   . Diabetes Maternal Grandfather    Allergies as of 05/15/2018   No Known Allergies     Medication List       Accurate as of May 15, 2018  1:44 PM. Always use your most recent med list.        Azelastine-Fluticasone 137-50 MCG/ACT Susp 1 spray per each  nostril BID   ipratropium 0.06 %  nasal spray Commonly known as:  ATROVENT Place 2 sprays into both nostrils 4 (four) times daily.   levocetirizine 5 MG tablet Commonly known as:  XYZAL Take 1 tablet (5 mg total) by mouth every evening. Needs office visit prior to any additional refills   montelukast 10 MG tablet Commonly known as:  SINGULAIR Take 1 tablet (10 mg total) by mouth at bedtime. Needs office visit prior to any additional refills.   sertraline 50 MG tablet Commonly known as:  ZOLOFT Take 50 mg by mouth daily.   triamcinolone 55 MCG/ACT Aero nasal inhaler Commonly known as:  NASACORT Place 2 sprays into the nose daily.   XULANE 150-35 MCG/24HR transdermal patch Generic drug:  norelgestromin-ethinyl estradiol       All past medical history, surgical history, allergies, family history, immunizations andmedications were updated in the EMR today and reviewed under the history and medication portions of their EMR.     ROS: Negative, with the exception of above mentioned in HPI   Objective:  BP 123/85 (BP Location: Left Arm, Patient Position: Sitting, Cuff Size: Normal)   Pulse 85   Temp (!) 97.4 F (36.3 C) (Oral)   Resp 16   Ht 5\' 1"  (1.549 m)   Wt 184 lb (83.5 kg)  LMP 05/15/2018   SpO2 96%   BMI 34.77 kg/m  Body mass index is 34.77 kg/m. Gen: Afebrile. No acute distress. Nontoxic in appearance, well developed, well nourished.  HENT: AT. [place]. Bilateral TM visualized with mild erythema and fluid present. MMM, no oral lesions. Bilateral nares with drainage, no redness or swelling. Throat without erythema or exudates. PND, cough, hoarseness, TTP max sinus.  Eyes:Pupils Equal Round Reactive to light, Extraocular movements intact,  Conjunctiva without redness, discharge or icterus. Neck/lymp/endocrine: Supple,left and right ant cervical lymphadenopathy CV: RRR  Chest: CTAB, no wheeze or crackles. Good air movement, normal resp effort.  Skin: no rashes, purpura or petechiae.  Neuro:  Normal gait.  PERLA. EOMi. Alert. Oriented x3   No exam data present No results found. No results found for this or any previous visit (from the past 24 hour(s)).  Assessment/Plan: Diane Cross is a 33 y.o. female present for OV for  Allergic rhinitis due to pollen, unspecified seasonality Allergy appt rescheduled. She was sick--> scheduled for Feb.4 Chronic sinusitis, unspecified location Rest, hydrate.  Start mucinex (DM if cough),  nasal saline.  Continue nasal sprays, allergy regimen.  Doxycyline prescribed, take until completed.  Steroid shot today If cough present it can last up to 6-8 weeks.  F/U 2 around time of allergy appt, if improved will order sinus xray for eval. And consider ENT (for sinus) referral.    Reviewed expectations re: course of current medical issues.  Discussed self-management of symptoms.  Outlined signs and symptoms indicating need for more acute intervention.  Patient verbalized understanding and all questions were answered.  Patient received an After-Visit Summary.    No orders of the defined types were placed in this encounter.    Note is dictated utilizing voice recognition software. Although note has been proof read prior to signing, occasional typographical errors still can be missed. If any questions arise, please do not hesitate to call for verification.   electronically signed by:  Felix Pacini, DO  [facility] [place]

## 2018-05-15 NOTE — Patient Instructions (Signed)
Rest, hydrate.  Start mucinex (DM if cough),  nasal saline.  Continue nasal sprays, allergy regimen.  Doxycyline prescribed, take until completed.  Steroid shot today If cough present it can last up to 6-8 weeks.  F/U 2 weeks of not improved.    Follow up around 2/4 and if feeling better will get sinus xray at that time .    Sinusitis, Adult Sinusitis is soreness and swelling (inflammation) of your sinuses. Sinuses are hollow spaces in the bones around your face. They are located:  Around your eyes.  In the middle of your forehead.  Behind your nose.  In your cheekbones. Your sinuses and nasal passages are lined with a fluid called mucus. Mucus drains out of your sinuses. Swelling can trap mucus in your sinuses. This lets germs (bacteria, virus, or fungus) grow, which leads to infection. Most of the time, this condition is caused by a virus. What are the causes? This condition is caused by:  Allergies.  Asthma.  Germs.  Things that block your nose or sinuses.  Growths in the nose (nasal polyps).  Chemicals or irritants in the air.  Fungus (rare). What increases the risk? You are more likely to develop this condition if:  You have a weak body defense system (immune system).  You do a lot of swimming or diving.  You use nasal sprays too much.  You smoke. What are the signs or symptoms? The main symptoms of this condition are pain and a feeling of pressure around the sinuses. Other symptoms include:  Stuffy nose (congestion).  Runny nose (drainage).  Swelling and warmth in the sinuses.  Headache.  Toothache.  A cough that may get worse at night.  Mucus that collects in the throat or the back of the nose (postnasal drip).  Being unable to smell and taste.  Being very tired (fatigue).  A fever.  Sore throat.  Bad breath. How is this diagnosed? This condition is diagnosed based on:  Your symptoms.  Your medical history.  A physical  exam.  Tests to find out if your condition is short-term (acute) or long-term (chronic). Your doctor may: ? Check your nose for growths (polyps). ? Check your sinuses using a tool that has a light (endoscope). ? Check for allergies or germs. ? Do imaging tests, such as an MRI or CT scan. How is this treated? Treatment for this condition depends on the cause and whether it is short-term or long-term.  If caused by a virus, your symptoms should go away on their own within 10 days. You may be given medicines to relieve symptoms. They include: ? Medicines that shrink swollen tissue in the nose. ? Medicines that treat allergies (antihistamines). ? A spray that treats swelling of the nostrils. ? Rinses that help get rid of thick mucus in your nose (nasal saline washes).  If caused by bacteria, your doctor may wait to see if you will get better without treatment. You may be given antibiotic medicine if you have: ? A very bad infection. ? A weak body defense system.  If caused by growths in the nose, you may need to have surgery. Follow these instructions at home: Medicines  Take, use, or apply over-the-counter and prescription medicines only as told by your doctor. These may include nasal sprays.  If you were prescribed an antibiotic medicine, take it as told by your doctor. Do not stop taking the antibiotic even if you start to feel better. Hydrate and humidify   Drink  enough water to keep your pee (urine) pale yellow.  Use a cool mist humidifier to keep the humidity level in your home above 50%.  Breathe in steam for 10-15 minutes, 3-4 times a day, or as told by your doctor. You can do this in the bathroom while a hot shower is running.  Try not to spend time in cool or dry air. Rest  Rest as much as you can.  Sleep with your head raised (elevated).  Make sure you get enough sleep each night. General instructions   Put a warm, moist washcloth on your face 3-4 times a day,  or as often as told by your doctor. This will help with discomfort.  Wash your hands often with soap and water. If there is no soap and water, use hand sanitizer.  Do not smoke. Avoid being around people who are smoking (secondhand smoke).  Keep all follow-up visits as told by your doctor. This is important. Contact a doctor if:  You have a fever.  Your symptoms get worse.  Your symptoms do not get better within 10 days. Get help right away if:  You have a very bad headache.  You cannot stop throwing up (vomiting).  You have very bad pain or swelling around your face or eyes.  You have trouble seeing.  You feel confused.  Your neck is stiff.  You have trouble breathing. Summary  Sinusitis is swelling of your sinuses. Sinuses are hollow spaces in the bones around your face.  This condition is caused by tissues in your nose that become inflamed or swollen. This traps germs. These can lead to infection.  If you were prescribed an antibiotic medicine, take it as told by your doctor. Do not stop taking it even if you start to feel better.  Keep all follow-up visits as told by your doctor. This is important. This information is not intended to replace advice given to you by your health care provider. Make sure you discuss any questions you have with your health care provider. Document Released: 09/27/2007 Document Revised: 09/10/2017 Document Reviewed: 09/10/2017 Elsevier Interactive Patient Education  2019 [employer].

## 2018-05-16 ENCOUNTER — Ambulatory Visit: Payer: BLUE CROSS/BLUE SHIELD | Admitting: Family Medicine

## 2018-05-28 ENCOUNTER — Ambulatory Visit: Payer: BLUE CROSS/BLUE SHIELD | Admitting: Allergy

## 2018-05-28 ENCOUNTER — Encounter: Payer: Self-pay | Admitting: Allergy

## 2018-05-28 VITALS — BP 124/70 | HR 78 | Temp 97.7°F | Resp 17 | Ht 62.0 in | Wt 184.5 lb

## 2018-05-28 DIAGNOSIS — H1013 Acute atopic conjunctivitis, bilateral: Secondary | ICD-10-CM | POA: Diagnosis not present

## 2018-05-28 DIAGNOSIS — J329 Chronic sinusitis, unspecified: Secondary | ICD-10-CM | POA: Diagnosis not present

## 2018-05-28 DIAGNOSIS — J3089 Other allergic rhinitis: Secondary | ICD-10-CM

## 2018-05-28 MED ORDER — EPINEPHRINE 0.3 MG/0.3ML IJ SOAJ: 0.3000 mg | Freq: Once | INTRAMUSCULAR | 2 refills | Status: DC | PRN

## 2018-05-28 NOTE — Progress Notes (Signed)
New Patient Note  RE: Diane Cross MRN: [id number] DOB: 02-Sep-1985 Date of Office Visit: 05/28/2018  Referring provider: Natalia Leatherwood, DO Primary care provider: Natalia Leatherwood, DO  Chief Complaint: Allergic Rhinitis   History of Present Illness: I had the pleasure of seeing Diane Cross for initial evaluation at the [facility] on 05/28/2018. She is a 33 y.o. female, who is referred here by Felix Pacini A, DO for the evaluation of allergic rhinitis.   Allergic rhinitis:  She reports symptoms of sinus congestion, ear issues, sneezing, rhinorrhea, itchy/watery. Symptoms have been going on for 20+ years but worse the past few years. The symptoms are present all year around. Other triggers include exposure to dogs, pollen. Anosmia: no. Headache: no. She has used Dymista, Atrovent, Xyzal, Singulair with some improvement in symptoms. Sinus infections: 3 since October and treated with antibiotics. Previous work up includes: 2019 skin testing was done at ENT office which showed multiple positives per patient report. Their recommendations was to start allergy injections but patient delayed due to location.  Previous ENT evaluation: yes. Previous sinus imaging: none. No previous sinus surgeries.  Assessment and Plan: Diane Cross is a 33 y.o. female with: Other allergic rhinitis Perennial allergic rhinoconjunctivitis symptoms for the past 20 years but worse in the last few years.  Currently using Dymista, Atrovent, Xyzal and Singulair with some benefit.  2019 skin testing done by ENT showed positive to ragweed, some grass and weed, dust mites and dog.  Today's skin prick testing was additionally positive to weeds, grass, trees.  Discussed environmental control measures.  Had a detailed discussion with patient/family that clinical history is suggestive of allergic rhinitis, and may benefit from allergy immunotherapy (AIT). Discussed in detail regarding the dosing, schedule, side  effects (mild to moderate local allergic reaction and rarely systemic allergic reactions including anaphylaxis), and benefits (significant improvement in nasal symptoms, seasonal flares of asthma) of immunotherapy with the patient. There is significant time commitment involved with allergy shots, which includes weekly immunotherapy injections then biweekly to monthly injections for 3-5 years.   We will start on allergy immunotherapy. Consent is signed.   I have prescribed epinephrine injectable and demonstrated proper use. For mild symptoms you can take over the counter antihistamines such as Benadryl and monitor symptoms closely. If symptoms worsen or if you have severe symptoms including breathing issues, throat closure, significant swelling, whole body hives, severe diarrhea and vomiting, lightheadedness then inject epinephrine and seek immediate medical care afterwards.  Continue Dymista 1 spray twice a day.  May use Atrovent nasal spray as needed.  Continue Singulair 10mg  daily.  Continue Xyzal 5mg  daily and may take twice a day if needed.  Nasal saline spray (i.e., Simply Saline) or nasal saline lavage (i.e., NeilMed) is recommended as needed and prior to medicated nasal sprays.  Allergic conjunctivitis of both eyes See assessment and plan as above.  Frequent sinus infections Had 3 sinus infections since October which required antibiotics.  Most likely due to environmental allergies causing constant inflammation in the sinuses predisposing her to the infections.   Keep track of infections. If they still continue despite above therapy then will obtain basic immune bloodwork and/or sinus imaging to rule out other etiologies.  Return in about 3 months (around 08/26/2018).  Meds ordered this encounter  Medications  . EPINEPHrine (AUVI-Q) 0.3 mg/0.3 mL IJ SOAJ injection    Sig: Inject 0.3 mLs (0.3 mg total) into the muscle once as needed for  up to 1 dose for anaphylaxis.    Dispense:   2 Device    Refill:  2    [phone]   Other allergy screening: Asthma: no Rhino conjunctivitis: yes Food allergy: no Medication allergy: no Hymenoptera allergy: no Urticaria: no Eczema:no History of recurrent infections suggestive of immunodeficency: no  Previous testing:  Diagnostics: Skin Testing: Environmental allergy panel. Positive test to: grass, weed, trees. Results discussed with patient/family. Airborne Adult Perc - 05/28/18 1116    Time Antigen Placed  1111    Allergen Manufacturer  Waynette Buttery    Location  Back    Number of Test  45    Panel 1  Select    1. Control-Buffer 50% Glycerol  Negative    2. Control-Histamine 1 mg/ml  4+    3. Albumin saline  Negative    8. Meadow Fescue  2+    9. Perennial Rye  3+    10. Sweet Vernal  3+    11. Timothy  4+    12. Cocklebur  2+    13. Burweed Marshelder  Negative    16. Plantain,  English  Negative    17. Lamb's Quarters  Negative    18. Sheep Sorrell  Negative    19. Rough Pigweed  2+    20. Marsh Elder, Rough  2+    21. Mugwort, Common  Negative    22. Ash mix  Negative    23. Birch mix  4+    24. Beech American  3+    29. Hickory mix  3+    30. Maple mix  2+    31. Oak, Guinea-Bissau mix  Negative    33. Pine mix  Negative    34. Sycamore Eastern  Negative    35. Walnut, Black Pollen  2+    36. Alternaria alternata  Negative    37. Cladosporium Herbarum  Negative    38. Aspergillus mix  Negative    39. Penicillium mix  Negative    40. Bipolaris sorokiniana (Helminthosporium)  Negative    41. Drechslera spicifera (Curvularia)  Negative    42. Mucor plumbeus  Negative    43. Fusarium moniliforme  Negative    44. Aureobasidium pullulans (pullulara)  Negative    45. Rhizopus oryzae  Negative    46. Botrytis cinera  Negative    47. Epicoccum nigrum  Negative    48. Phoma betae  Negative    49. Candida Albicans  Negative    50. Trichophyton mentagrophytes  Negative    53. Cat Hair 10,000 BAU/ml  Negative     55. Mixed Feathers  Negative    56. Horse Epithelia  Negative    57. Cockroach, German  Negative    58. Mouse  Negative    59. Tobacco Leaf  Negative     Intradermal - 05/28/18 1141    Time Antigen Placed  1140    Allergen Manufacturer  Waynette Buttery    Location  Arm    Number of Test  7    Intradermal  Select    Control  Negative    Mold 1  Negative    Mold 2  Negative    Mold 3  Negative    Mold 4  Negative    Cat  Negative    Cockroach  Negative       Past Medical History: Patient Active Problem List   Diagnosis Date Noted  . Other allergic rhinitis 05/28/2018  .  Frequent sinus infections 05/28/2018  . Allergic conjunctivitis of both eyes 05/28/2018  . Cough 03/14/2018  . Chronic sinusitis 01/10/2017  . Allergic rhinitis due to pollen 01/10/2017  . Mass of wrist 10/27/2016  . Lymphadenopathy 08/21/2016  . Enlarged tonsils 08/21/2016   Past Medical History:  Diagnosis Date  . Allergy   . Anxiety   . Migraines    Past Surgical History: Past Surgical History:  Procedure Laterality Date  . LIPOMA EXCISION     ? pt reports fatty tumor removal of neck.   Medication List:  Current Outpatient Medications  Medication Sig Dispense Refill  . Azelastine-Fluticasone 137-50 MCG/ACT SUSP 1 spray per each  nostril BID 23 g 11  . ipratropium (ATROVENT) 0.06 % nasal spray Place 2 sprays into both nostrils 4 (four) times daily. 15 mL 5  . levocetirizine (XYZAL) 5 MG tablet Take 1 tablet (5 mg total) by mouth every evening. Needs office visit prior to any additional refills 30 tablet 0  . montelukast (SINGULAIR) 10 MG tablet Take 1 tablet (10 mg total) by mouth at bedtime. Needs office visit prior to any additional refills. 30 tablet 0  . sertraline (ZOLOFT) 100 MG tablet     . XULANE 150-35 MCG/24HR transdermal patch   8  . EPINEPHrine (AUVI-Q) 0.3 mg/0.3 mL IJ SOAJ injection Inject 0.3 mLs (0.3 mg total) into the muscle once as needed for up to 1 dose for anaphylaxis. 2 Device 2    No current facility-administered medications for this visit.    Allergies: No Known Allergies Social History: Social History   Socioeconomic History  . Marital status: Married    Spouse name: Amalia Hailey  . Number of children: 1  . Years of education: 41  . Highest education level: Not on file  Occupational History  . Occupation: Photographer  Social Needs  . Financial resource strain: Not on file  . Food insecurity:    Worry: Not on file    Inability: Not on file  . Transportation needs:    Medical: Not on file    Non-medical: Not on file  Tobacco Use  . Smoking status: Never Smoker  . Smokeless tobacco: Never Used  Substance and Sexual Activity  . Alcohol use: No  . Drug use: No  . Sexual activity: Yes    Partners: Male    Birth control/protection: Condom  Lifestyle  . Physical activity:    Days per week: Not on file    Minutes per session: Not on file  . Stress: Not on file  Relationships  . Social connections:    Talks on phone: Not on file    Gets together: Not on file    Attends religious service: Not on file    Active member of club or organization: Not on file    Attends meetings of clubs or organizations: Not on file    Relationship status: Not on file  Other Topics Concern  . Not on file  Social History Narrative   Married to [place], 1 child [name].   Chief Operating Officer, works full time in Photographer.    Drinks caffeine. Takes a daily vitamin.    Wears seatbelt. Smoke detector in the home.    Firearms locked in the home.    Feels safe in her relationships.    Lives in a house built in 2017. Smoking: denies Occupation: Buyer, retail History: Water Damage/mildew in the house: no Carpet in the family room: no Carpet in the bedroom: yes  Heating: gas and electric Cooling: central Pet: yes 2 dogs x 10 yrs, 6 yrs  Family History: Family History  Problem Relation Age of Onset  . Hypertension Mother   . Heart attack Father   . Autism  Brother   . Diabetes Maternal Grandmother   . Diabetes Maternal Grandfather    Problem                               Relation Asthma                                   No Eczema                                Daughter  Food allergy                          Daughter  Allergic rhino conjunctivitis     Daughter   Review of Systems  Constitutional: Negative for appetite change, chills, fever and unexpected weight change.  HENT: Positive for congestion, postnasal drip and rhinorrhea.   Eyes: Positive for itching.  Respiratory: Negative for cough, chest tightness, shortness of breath and wheezing.   Cardiovascular: Negative for chest pain.  Gastrointestinal: Negative for abdominal pain.  Genitourinary: Negative for difficulty urinating.  Skin: Negative for rash.  Allergic/Immunologic: Positive for environmental allergies. Negative for food allergies.  Neurological: Negative for headaches.   Objective: BP 124/70 (BP Location: Left Arm, Patient Position: Sitting, Cuff Size: Normal)   Pulse 78   Temp 97.7 F (36.5 C) (Oral)   Resp 17   Ht 5\' 2"  (1.575 m)   Wt 184 lb 8 oz (83.7 kg)   LMP 05/15/2018   SpO2 94%   BMI 33.75 kg/m  Body mass index is 33.75 kg/m. Physical Exam  Constitutional: She is oriented to person, place, and time. She appears well-developed and well-nourished.  HENT:  Head: Normocephalic and atraumatic.  Right Ear: External ear normal.  Left Ear: External ear normal.  Nose: Nose normal.  Mouth/Throat: Oropharynx is clear and moist.  Eyes: Conjunctivae and EOM are normal.  Neck: Neck supple.  Cardiovascular: Normal rate, regular rhythm and normal heart sounds. Exam reveals no gallop and no friction rub.  No murmur heard. Pulmonary/Chest: Effort normal and breath sounds normal. She has no wheezes. She has no rales.  Abdominal: Soft.  Lymphadenopathy:    She has no cervical adenopathy.  Neurological: She is alert and oriented to person, place, and time.  Skin:  Skin is warm. No rash noted.  Psychiatric: She has a normal mood and affect. Her behavior is normal.  Nursing note and vitals reviewed.  The plan was reviewed with the patient/family, and all questions/concerned were addressed.  It was my pleasure to see Diane Cross today and participate in her care. Please feel free to contact me with any questions or concerns.  Sincerely,  Wyline MoodYoon Voncille Simm, DO Allergy & Immunology  [facility] [place] office: [phone] [facility] office: [phone]

## 2018-05-28 NOTE — Patient Instructions (Addendum)
Today's skin testing showed additional positive to weed pollen, grass pollen, tree pollen.  Results of the tests were given.  We will start you on allergy injections. I have prescribed epinephrine injectable and demonstrated proper use. For mild symptoms you can take over the counter antihistamines such as Benadryl and monitor symptoms closely. If symptoms worsen or if you have severe symptoms including breathing issues, throat closure, significant swelling, whole body hives, severe diarrhea and vomiting, lightheadedness then inject epinephrine and seek immediate medical care afterwards.  Continue dymista 1 spray twice a day. May use atrovent nasal spray as needed. Continue singulair 10mg  daily. Continue xyzal 5mg  daily and may take twice a day if needed.  Nasal saline spray (i.e., Simply Saline) or nasal saline lavage (i.e., NeilMed) is recommended as needed and prior to medicated nasal sprays.  Keep track of infections.  Follow up in 3 months   Reducing Pollen Exposure . Pollen seasons: trees (spring), grass (summer) and ragweed/weeds (fall). Marland Kitchen. Keep windows closed in your home and car to lower pollen exposure.  Diane Cross. Install air conditioning in the bedroom and throughout the house if possible.  . Avoid going out in dry windy days - especially early morning. . Pollen counts are highest between 5 - 10 AM and on dry, hot and windy days.  . Save outside activities for late afternoon or after a heavy rain, when pollen levels are lower.  . Avoid mowing of grass if you have grass pollen allergy. Marland Kitchen. Be aware that pollen can also be transported indoors on people and pets.  . Dry your clothes in an automatic dryer rather than hanging them outside where they might collect pollen.  . Rinse hair and eyes before bedtime. Control of House Dust Mite Allergen . Dust mite allergens are a common trigger of allergy and asthma symptoms. While they can be found throughout the house, these microscopic creatures  thrive in warm, humid environments such as bedding, upholstered furniture and carpeting. . Because so much time is spent in the bedroom, it is essential to reduce mite levels there.  . Encase pillows, mattresses, and box springs in special allergen-proof fabric covers or airtight, zippered plastic covers.  . Bedding should be washed weekly in hot water (130 F) and dried in a hot dryer. Allergen-proof covers are available for comforters and pillows that can't be regularly washed.  Diane Cross. Wash the allergy-proof covers every few months. Minimize clutter in the bedroom. Keep pets out of the bedroom.  Marland Kitchen. Keep humidity less than 50% by using a dehumidifier or air conditioning. You can buy a humidity measuring device called a hygrometer to monitor this.  . If possible, replace carpets with hardwood, linoleum, or washable area rugs. If that's not possible, vacuum frequently with a vacuum that has a HEPA filter. . Remove all upholstered furniture and non-washable window drapes from the bedroom. . Remove all non-washable stuffed toys from the bedroom.  Wash stuffed toys weekly. Pet Allergen Avoidance: . Contrary to popular opinion, there are no "hypoallergenic" breeds of dogs or cats. That is because people are not allergic to an animal's hair, but to an allergen found in the animal's saliva, dander (dead skin flakes) or urine. Pet allergy symptoms typically occur within minutes. For some people, symptoms can build up and become most severe 8 to 12 hours after contact with the animal. People with severe allergies can experience reactions in public places if dander has been transported on the pet owners' clothing. Marland Kitchen. Keeping an animal outdoors  is only a partial solution, since homes with pets in the yard still have higher concentrations of animal allergens. . Before getting a pet, ask your allergist to determine if you are allergic to animals. If your pet is already considered part of your family, try to minimize contact  and keep the pet out of the bedroom and other rooms where you spend a great deal of time. . As with dust mites, vacuum carpets often or replace carpet with a hardwood floor, tile or linoleum. . High-efficiency particulate air (HEPA) cleaners can reduce allergen levels over time. . While dander and saliva are the source of cat and dog allergens, urine is the source of allergens from rabbits, hamsters, mice and [place]guinea pigs; so ask a non-allergic family member to clean the animal's cage. . If you have a pet allergy, talk to your allergist about the potential for allergy immunotherapy (allergy shots). This strategy can often provide long-term relief.

## 2018-05-28 NOTE — Assessment & Plan Note (Signed)
Had 3 sinus infections since October which required antibiotics.  Most likely due to environmental allergies causing constant inflammation in the sinuses predisposing her to the infections.   Keep track of infections. If they still continue despite above therapy then will obtain basic immune bloodwork and/or sinus imaging to rule out other etiologies.

## 2018-05-28 NOTE — Assessment & Plan Note (Signed)
Perennial allergic rhinoconjunctivitis symptoms for the past 20 years but worse in the last few years.  Currently using Dymista, Atrovent, Xyzal and Singulair with some benefit.  2019 skin testing done by ENT showed positive to ragweed, some grass and weed, dust mites and dog.  Today's skin prick testing was additionally positive to weeds, grass, trees.  Discussed environmental control measures.  Had a detailed discussion with patient/family that clinical history is suggestive of allergic rhinitis, and may benefit from allergy immunotherapy (AIT). Discussed in detail regarding the dosing, schedule, side effects (mild to moderate local allergic reaction and rarely systemic allergic reactions including anaphylaxis), and benefits (significant improvement in nasal symptoms, seasonal flares of asthma) of immunotherapy with the patient. There is significant time commitment involved with allergy shots, which includes weekly immunotherapy injections then biweekly to monthly injections for 3-5 years.   We will start on allergy immunotherapy. Consent is signed.   I have prescribed epinephrine injectable and demonstrated proper use. For mild symptoms you can take over the counter antihistamines such as Benadryl and monitor symptoms closely. If symptoms worsen or if you have severe symptoms including breathing issues, throat closure, significant swelling, whole body hives, severe diarrhea and vomiting, lightheadedness then inject epinephrine and seek immediate medical care afterwards.  Continue Dymista 1 spray twice a day.  May use Atrovent nasal spray as needed.  Continue Singulair 10mg  daily.  Continue Xyzal 5mg  daily and may take twice a day if needed.  Nasal saline spray (i.e., Simply Saline) or nasal saline lavage (i.e., NeilMed) is recommended as needed and prior to medicated nasal sprays.

## 2018-05-28 NOTE — Assessment & Plan Note (Signed)
.   See assessment and plan as above. 

## 2018-05-29 DIAGNOSIS — J301 Allergic rhinitis due to pollen: Secondary | ICD-10-CM | POA: Diagnosis not present

## 2018-05-29 NOTE — Progress Notes (Signed)
VIALS EXP 05-30-2019 

## 2018-05-30 DIAGNOSIS — J3089 Other allergic rhinitis: Secondary | ICD-10-CM

## 2018-06-11 ENCOUNTER — Ambulatory Visit (INDEPENDENT_AMBULATORY_CARE_PROVIDER_SITE_OTHER): Payer: BLUE CROSS/BLUE SHIELD

## 2018-06-11 DIAGNOSIS — J309 Allergic rhinitis, unspecified: Secondary | ICD-10-CM | POA: Diagnosis not present

## 2018-06-11 NOTE — Progress Notes (Signed)
Immunotherapy   Patient Details  Name: Diane Cross MRN: [id number] Date of Birth: [birth year]  06/11/2018  [name] started allergy injections and received 0.20ml out of both blue vials. One with Grass-Weeds-RW-Tree and the other with DM-Dog. Patient waited 30 minutes with no problems. Following schedule:  B Frequency: Weekly Epi-Pen: Yes Consent signed and patient instructions given.   Dub Mikes 06/11/2018, 8:24 AM

## 2018-06-12 DIAGNOSIS — F419 Anxiety disorder, unspecified: Secondary | ICD-10-CM | POA: Diagnosis not present

## 2018-06-12 DIAGNOSIS — Z566 Other physical and mental strain related to work: Secondary | ICD-10-CM | POA: Diagnosis not present

## 2018-06-17 ENCOUNTER — Encounter: Payer: Self-pay | Admitting: Family Medicine

## 2018-06-18 ENCOUNTER — Ambulatory Visit (INDEPENDENT_AMBULATORY_CARE_PROVIDER_SITE_OTHER): Payer: BLUE CROSS/BLUE SHIELD

## 2018-06-18 DIAGNOSIS — J309 Allergic rhinitis, unspecified: Secondary | ICD-10-CM | POA: Diagnosis not present

## 2018-06-18 NOTE — Telephone Encounter (Signed)
Pt is asking for GI referral. I Placed the referral and have it pending. Please advise.

## 2018-06-20 ENCOUNTER — Telehealth: Payer: Self-pay | Admitting: Family Medicine

## 2018-06-20 DIAGNOSIS — R1310 Dysphagia, unspecified: Secondary | ICD-10-CM | POA: Insufficient documentation

## 2018-06-20 NOTE — Telephone Encounter (Signed)
Pt was called and detailed VM letting pt know that the GI referral was placed and pt should hear from them to schedule within 2 weeks. Okay to leave message per DPR.

## 2018-06-20 NOTE — Telephone Encounter (Signed)
GI referral placed. Please let pt know.

## 2018-06-25 ENCOUNTER — Ambulatory Visit (INDEPENDENT_AMBULATORY_CARE_PROVIDER_SITE_OTHER): Payer: BLUE CROSS/BLUE SHIELD

## 2018-06-25 DIAGNOSIS — J309 Allergic rhinitis, unspecified: Secondary | ICD-10-CM | POA: Diagnosis not present

## 2018-06-27 ENCOUNTER — Encounter: Payer: Self-pay | Admitting: Physician Assistant

## 2018-07-02 ENCOUNTER — Ambulatory Visit (INDEPENDENT_AMBULATORY_CARE_PROVIDER_SITE_OTHER): Payer: BLUE CROSS/BLUE SHIELD

## 2018-07-02 DIAGNOSIS — J309 Allergic rhinitis, unspecified: Secondary | ICD-10-CM | POA: Diagnosis not present

## 2018-07-08 ENCOUNTER — Ambulatory Visit: Payer: BLUE CROSS/BLUE SHIELD | Admitting: Physician Assistant

## 2018-07-08 ENCOUNTER — Encounter: Payer: Self-pay | Admitting: Physician Assistant

## 2018-07-08 ENCOUNTER — Other Ambulatory Visit: Payer: Self-pay

## 2018-07-08 VITALS — BP 120/80 | HR 94 | Temp 97.0°F | Ht 62.0 in | Wt 192.0 lb

## 2018-07-08 DIAGNOSIS — R1314 Dysphagia, pharyngoesophageal phase: Secondary | ICD-10-CM | POA: Diagnosis not present

## 2018-07-08 MED ORDER — OMEPRAZOLE 40 MG PO CPDR: 40.0000 mg | DELAYED_RELEASE_CAPSULE | Freq: Every day | ORAL | 3 refills | Status: DC

## 2018-07-08 NOTE — Patient Instructions (Addendum)
If you are age 34 or older, your body mass index should be between 23-30. Your Body mass index is 35.12 kg/m. If this is out of the aforementioned range listed, please consider follow up with your Primary Care Provider.  If you are age 2 or younger, your body mass index should be between 19-25. Your Body mass index is 35.12 kg/m. If this is out of the aformentioned range listed, please consider follow up with your Primary Care Provider.   You have been scheduled for an endoscopy. Please follow written instructions given to you at your visit today. If you use inhalers (even only as needed), please bring them with you on the day of your procedure. Your physician has requested that you go to www.startemmi.com and enter the access code given to you at your visit today. This web site gives a general overview about your procedure. However, you should still follow specific instructions given to you by our office regarding your preparation for the procedure.  We have sent the following medications to your pharmacy for you to pick up at your convenience: Omeprazole  Thank you for choosing me and [facility].    Hyacinth Meeker, PA-C   To help prevent the possible spread of infection to our patients, communities, and staff; we will be implementing the following measures:  Please only allow one visitor/family member to accompany you to any upcoming appointments with [facility]. If you have any concerns about this please contact our office to discuss prior to the appointment.

## 2018-07-08 NOTE — Progress Notes (Signed)
Chief Complaint: Dysphagia  HPI:    Mrs. Tudor is a 33 year old female with a past medical history as listed below, who was referred to me by Felix Pacini A, DO for a complaint of dysphagia.      August 2019 patient underwent pharyngeal function study.  Modified barium study with complaint of food sticking in her throat leading to emesis.  Patient then had videofluoroscopy.  At that time she demonstrated a mild/moderate pharyngeal esophageal dysphasia consistent with reflux.  Pharyngeal phase marked by multiple swallows required to clear bolus through UES and esophagus.  Esophageal phase is marked with a very slow clearance of solids and retrograde movement of thin liquids.  It was thought that likely with a full meal patient would have retrograde movement which resulted in emesis consistent with her complaints.  She was diagnosed with pharyngeal esophageal dysphagia.    Today, the patient describes that she has been having solid food dysphagia intermittently since 2017 when she was pregnant with her first child.  This comes and goes and now is occurring maybe 2-3 times a week.  Certain foods are worse including breads, rice and spaghetti.  Patient feels as though they get stuck halfway down her throat and she either has to wait for them to go down or sometimes has to make herself vomit in order to get the food back up.  Describes being told this is possibly reflux in the past, but never being on medication.    Medical history also significant for multiple allergies.    Denies fever, chills, weight loss, nausea, heartburn, reflux, abdominal pain, change in bowel habits or symptoms that awaken her from sleep.  Past Medical History:  Diagnosis Date  . Allergy   . Anxiety   . Migraines     Past Surgical History:  Procedure Laterality Date  . LIPOMA EXCISION     ? pt reports fatty tumor removal of neck.    Current Outpatient Medications  Medication Sig Dispense Refill  .  Azelastine-Fluticasone 137-50 MCG/ACT SUSP 1 spray per each  nostril BID 23 g 11  . EPINEPHrine (AUVI-Q) 0.3 mg/0.3 mL IJ SOAJ injection Inject 0.3 mLs (0.3 mg total) into the muscle once as needed for up to 1 dose for anaphylaxis. 2 Device 2  . ipratropium (ATROVENT) 0.06 % nasal spray Place 2 sprays into both nostrils 4 (four) times daily. 15 mL 5  . levocetirizine (XYZAL) 5 MG tablet Take 1 tablet (5 mg total) by mouth every evening. Needs office visit prior to any additional refills 30 tablet 0  . montelukast (SINGULAIR) 10 MG tablet Take 1 tablet (10 mg total) by mouth at bedtime. Needs office visit prior to any additional refills. 30 tablet 0  . sertraline (ZOLOFT) 100 MG tablet     . XULANE 150-35 MCG/24HR transdermal patch   8   No current facility-administered medications for this visit.     Allergies as of 07/08/2018  . (No Known Allergies)    Family History  Problem Relation Age of Onset  . Hypertension Mother   . Heart attack Father   . Autism Brother   . Diabetes Maternal Grandmother   . Diabetes Maternal Grandfather     Social History   Socioeconomic History  . Marital status: Married    Spouse name: Amalia Hailey  . Number of children: 1  . Years of education: 70  . Highest education level: Not on file  Occupational History  . Occupation: Photographer  Social Needs  .  Financial resource strain: Not on file  . Food insecurity:    Worry: Not on file    Inability: Not on file  . Transportation needs:    Medical: Not on file    Non-medical: Not on file  Tobacco Use  . Smoking status: Never Smoker  . Smokeless tobacco: Never Used  Substance and Sexual Activity  . Alcohol use: No  . Drug use: No  . Sexual activity: Yes    Partners: Male    Birth control/protection: Condom  Lifestyle  . Physical activity:    Days per week: Not on file    Minutes per session: Not on file  . Stress: Not on file  Relationships  . Social connections:    Talks on phone: Not on file     Gets together: Not on file    Attends religious service: Not on file    Active member of club or organization: Not on file    Attends meetings of clubs or organizations: Not on file    Relationship status: Not on file  . Intimate partner violence:    Fear of current or ex partner: Not on file    Emotionally abused: Not on file    Physically abused: Not on file    Forced sexual activity: Not on file  Other Topics Concern  . Not on file  Social History Narrative   Married to [place], 1 child [name].   Chief Operating Officer, works full time in [employer].    Drinks caffeine. Takes a daily vitamin.    Wears seatbelt. Smoke detector in the home.    Firearms locked in the home.    Feels safe in her relationships.     Review of Systems:    Constitutional: No weight loss, fever or chills Skin: No rash  Cardiovascular: No chest pain  Respiratory: No SOB  Gastrointestinal: See HPI and otherwise negative Genitourinary: No dysuria  Neurological: No headache, dizziness or syncope Musculoskeletal: No new muscle or joint pain Hematologic: No bleeding Psychiatric: No history of depression or anxiety   Physical Exam:  Vital signs: BP 120/80 (BP Location: Left Arm, Patient Position: Sitting, Cuff Size: Normal)   Pulse 94   Temp (!) 97 F (36.1 C)   Ht  (1.575 m)   Wt 192 lb (87.1 kg)   BMI 35.12 kg/m   Constitutional:   Pleasant Caucasian female appears to be in NAD, Well developed, Well nourished, alert and cooperative Head:  Normocephalic and atraumatic. Eyes:   PEERL, EOMI. No icterus. Conjunctiva pink. Ears:  Normal auditory acuity. Neck:  Supple Throat: Oral cavity and pharynx without inflammation, swelling or lesion.  Respiratory: Respirations even and unlabored. Lungs clear to auscultation bilaterally.   No wheezes, crackles, or rhonchi.  Cardiovascular: Normal S1, S2. No MRG. Regular rate and rhythm. No peripheral edema, cyanosis or pallor.  Gastrointestinal:  Soft, nondistended,  nontender. No rebound or guarding. Normal bowel sounds. No appreciable masses or hepatomegaly. Rectal:  Not performed.  Msk:  Symmetrical without gross deformities.  Neurologic:  Alert and  oriented x4;  grossly normal neurologically.  Skin:   Dry and intact without significant lesions or rashes. Psychiatric:  Demonstrates good judgement and reason without abnormal affect or behaviors.  No recent labs.  Assessment: 1.  Dysphasia: For the past 3 years off-and-on, now 2-3 times a week, swallow study showing likely reflux, also with many allergies; question eosinophilic esophagitis versus stricture versus other  Plan: 1.  Scheduled patient for an EGD  in the [facility] with Dr. Russella Dar.  Did discuss risks, benefits, limitations and alternatives and the patient agrees to proceed. 2.  Started Omeprazole 40 mg daily, 30-60 minutes before breakfast #30 with 3 refills 3.  Reviewed anti-dysphagia measures including taking small bites, chewing well, avoiding distraction while eating and a chin tuck technique. 4.  We will try to obtain previous barium swallow 5.  Patient to follow in clinic per recommendations from Dr. Russella Dar after time of procedure.  Hyacinth Meeker, PA-C [facility] 07/08/2018, 8:44 AM  Cc: Felix Pacini A, DO

## 2018-07-08 NOTE — Progress Notes (Signed)
Reviewed and agree with initial management plan.  Garan Frappier T. Chasta Deshpande, MD FACG 

## 2018-07-11 ENCOUNTER — Telehealth: Payer: Self-pay

## 2018-07-11 NOTE — Telephone Encounter (Signed)
Covid-19 travel screening questions  Have you traveled in the last 14 days? If yes where?  Do you now or have you had a fever in the last 14 days?  Do you have any respiratory symptoms of shortness of breath or cough now or in the last 14 days?  Do you have a medical history of Congestive Heart Failure?  Do you have a medical history of lung disease?  Do you have any family members or close contacts with diagnosed or suspected Covid-19?      Called patient at 10:00 and left a message to call back regarding our screening survey.

## 2018-07-11 NOTE — Telephone Encounter (Signed)
Covid-19 travel screening questions  Have you traveled in the last 14 days? If yes where?  Do you now or have you had a fever in the last 14 days?  Do you have any respiratory symptoms of shortness of breath or cough now or in the last 14 days?  Do you have a medical history of Congestive Heart Failure?  Do you have a medical history of lung disease?  Do you have any family members or close contacts with diagnosed or suspected Covid-19?      Left a second message at 2:48, this time I left a detailed message about the screening questions and about care partners not coming up to the lobby.

## 2018-07-11 NOTE — Telephone Encounter (Signed)
Pt returned your call for screening COVID-19.

## 2018-07-12 ENCOUNTER — Encounter: Payer: Self-pay | Admitting: Gastroenterology

## 2018-07-12 ENCOUNTER — Ambulatory Visit (AMBULATORY_SURGERY_CENTER): Payer: BLUE CROSS/BLUE SHIELD | Admitting: Gastroenterology

## 2018-07-12 ENCOUNTER — Other Ambulatory Visit: Payer: Self-pay

## 2018-07-12 VITALS — BP 118/66 | HR 84 | Temp 96.5°F | Resp 18 | Ht 62.0 in | Wt 192.0 lb

## 2018-07-12 DIAGNOSIS — K222 Esophageal obstruction: Secondary | ICD-10-CM

## 2018-07-12 DIAGNOSIS — K200 Eosinophilic esophagitis: Secondary | ICD-10-CM

## 2018-07-12 DIAGNOSIS — K228 Other specified diseases of esophagus: Secondary | ICD-10-CM | POA: Diagnosis not present

## 2018-07-12 DIAGNOSIS — K219 Gastro-esophageal reflux disease without esophagitis: Secondary | ICD-10-CM | POA: Diagnosis not present

## 2018-07-12 DIAGNOSIS — R1310 Dysphagia, unspecified: Secondary | ICD-10-CM | POA: Diagnosis not present

## 2018-07-12 MED ORDER — SODIUM CHLORIDE 0.9 % IV SOLN: 500.0000 mL | Freq: Once | INTRAVENOUS | Status: DC

## 2018-07-12 NOTE — Progress Notes (Signed)
Report to PACU, RN, vss, BBS= Clear.  

## 2018-07-12 NOTE — Patient Instructions (Signed)
Clear liquid diet for 2 hours and then a soft diet the rest of the day, regular diet tomorrow Omeprazole 40mg  every morning Will make appointment to see Dr. Russella Dar in GI office in 4-6 weeks  YOU HAD AN ENDOSCOPIC PROCEDURE TODAY AT THE [facility]:   Refer to the procedure report that was given to you for any specific questions about what was found during the examination.  If the procedure report does not answer your questions, please call your gastroenterologist to clarify.  If you requested that your care partner not be given the details of your procedure findings, then the procedure report has been included in a sealed envelope for you to review at your convenience later.  YOU SHOULD EXPECT: Some feelings of bloating in the abdomen. Passage of more gas than usual.  Walking can help get rid of the air that was put into your GI tract during the procedure and reduce the bloating. If you had a lower endoscopy (such as a colonoscopy or flexible sigmoidoscopy) you may notice spotting of blood in your stool or on the toilet paper. If you underwent a bowel prep for your procedure, you may not have a normal bowel movement for a few days.  Please Note:  You might notice some irritation and congestion in your nose or some drainage.  This is from the oxygen used during your procedure.  There is no need for concern and it should clear up in a day or so.  SYMPTOMS TO REPORT IMMEDIATELY:   Following upper endoscopy (EGD)  Vomiting of blood or coffee ground material  New chest pain or pain under the shoulder blades  Painful or persistently difficult swallowing  New shortness of breath  Fever of 100F or higher  Black, tarry-looking stools  For urgent or emergent issues, a gastroenterologist can be reached at any hour by calling [phone].   DIET:  We do recommend a small meal at first, but then you may proceed to your regular diet.  Drink plenty of fluids but you should avoid alcoholic  beverages for 24 hours.  ACTIVITY:  You should plan to take it easy for the rest of today and you should NOT DRIVE or use heavy machinery until tomorrow (because of the sedation medicines used during the test).    FOLLOW UP: Our staff will call the number listed on your records the next business day following your procedure to check on you and address any questions or concerns that you may have regarding the information given to you following your procedure. If we do not reach you, we will leave a message.  However, if you are feeling well and you are not experiencing any problems, there is no need to return our call.  We will assume that you have returned to your regular daily activities without incident.  If any biopsies were taken you will be contacted by phone or by letter within the next 1-3 weeks.  Please call us at [phone] if you have not heard about the biopsies in 3 weeks.    SIGNATURES/CONFIDENTIALITY: You and/or your care partner have signed paperwork which will be entered into your electronic medical record.  These signatures attest to the fact that that the information above on your After Visit Summary has been reviewed and is understood.  Full responsibility of the confidentiality of this discharge information lies with you and/or your care-partner.

## 2018-07-12 NOTE — Progress Notes (Signed)
Pt's states no medical or surgical changes since previsit or office visit. 

## 2018-07-12 NOTE — Progress Notes (Signed)
Called to room to assist during endoscopic procedure.  Patient ID and intended procedure confirmed with present staff. Received instructions for my participation in the procedure from the performing physician.  

## 2018-07-12 NOTE — Op Note (Signed)
[facility] Patient Name: Diane Cross Procedure Date: 07/12/2018 7:25 AM MRN: [id number] Endoscopist: Meryl Dare , MD Age: 33 Referring MD:  Date of Birth: [birth year] Gender: Female Account #: [id number] Procedure:                Upper GI endoscopy Indications:              Dysphagia Medicines:                Monitored Anesthesia Care Procedure:                Pre-Anesthesia Assessment:                           - Prior to the procedure, a History and Physical                            was performed, and patient medications and                            allergies were reviewed. The patient's tolerance of                            previous anesthesia was also reviewed. The risks                            and benefits of the procedure and the sedation                            options and risks were discussed with the patient.                            All questions were answered, and informed consent                            was obtained. Prior Anticoagulants: The patient has                            taken no previous anticoagulant or antiplatelet                            agents. ASA Grade Assessment: II - A patient with                            mild systemic disease. After reviewing the risks                            and benefits, the patient was deemed in                            satisfactory condition to undergo the procedure.                           After obtaining informed consent, the endoscope was  passed under direct vision. Throughout the                            procedure, the patient's blood pressure, pulse, and                            oxygen saturations were monitored continuously. The                            Model GIF-HQ190 [id number]) scope was introduced                            through the mouth, and advanced to the second part                            of duodenum. The upper GI endoscopy was                          accomplished without difficulty. The patient                            tolerated the procedure well. Scope In: Scope Out: Findings:                 Mucosal changes including longitudinal furrows,                            white plaques and congestion (edema) were found in                            the mid esophagus and in the distal esophagus.                            Biopsies were taken with a cold forceps for                            histology.                           One benign-appearing, intrinsic moderate stenosis                            was found 32 cm from the incisors. This stenosis                            measured 1.2 cm (inner diameter). The stenosis was                            traversed. A guidewire was placed and the scope was                            withdrawn. Dilations were performed with Savary                            dilators with mild resistance at 13  mm, 14 mm and                            15 mm. No heme noted on dilators.                           The exam of the esophagus was otherwise normal.                           The entire examined stomach was normal.                           A few localized erosions without bleeding were                            found in the duodenal bulb.                           The second portion of the duodenum was normal. Complications:            No immediate complications. Estimated Blood Loss:     Estimated blood loss was minimal. Impression:               - Esophageal mucosal changes suggestive of                            eosinophilic esophagitis. Biopsied.                           - Benign-appearing esophageal stenosis. Dilated.                           - Normal stomach.                           - Duodenal erosions without bleeding.                           - Normal second portion of the duodenum. Recommendation:           - Patient has a contact number available for                             emergencies. The signs and symptoms of potential                            delayed complications were discussed with the                            patient. Return to normal activities tomorrow.                            Written discharge instructions were provided to the                            patient.                           -  Clear liquid diet for 2 hours, then advance as                            tolerated to soft diet today.                           - Resume prior diet tomorrow.                           - Continue present medications.                           - Resume omeprazole 40 mg po qam.                           - Await pathology results.                           - Return to GI office in 4-6 weeks. Meryl Dare, MD 07/12/2018 8:30:34 AM This report has been signed electronically.

## 2018-07-15 ENCOUNTER — Telehealth: Payer: Self-pay

## 2018-07-15 ENCOUNTER — Telehealth: Payer: Self-pay | Admitting: *Deleted

## 2018-07-15 NOTE — Telephone Encounter (Signed)
Since her dysphagia persists please schedule EGD with repeat dilation in 3-4 weeks in [facility]. Not uncommon to need 2 or 3 dilations to more adequately open a stricture.  I consider this urgent and not elective so it should be scheduled.

## 2018-07-15 NOTE — Telephone Encounter (Signed)
  Follow up Call-  Call back number 07/12/2018  Post procedure Call Back phone  # [phone]  Permission to leave phone message Yes  Some recent data might be hidden     Patient questions:  Do you have a fever, pain , or abdominal swelling? No. Pain Score  0 *  Have you tolerated food without any problems? Yes.    Have you been able to return to your normal activities? Yes.    Do you have any questions about your discharge instructions: Diet   No. Medications  No. Follow up visit  No.  Do you have questions or concerns about your Care? No.  Actions: * If pain score is 4 or above: No action needed, pain <4.

## 2018-07-15 NOTE — Telephone Encounter (Signed)
Left message on f/u call 

## 2018-07-15 NOTE — Telephone Encounter (Signed)
Pt called back this afternoon and c/o food sometimes getting "stuck in my mid chest area, where before it was getting stuck in my throat." She does say that the food is going down but is taking a while for example when she ate bread last night. I encouraged her to chew her food slowly and eat small meals. Will notify Dr. Russella Dar. SM

## 2018-07-15 NOTE — Telephone Encounter (Signed)
Pt returned your call--she reported that she now has "an uncomfortable feeling of food stuck in her chest."

## 2018-07-16 NOTE — Telephone Encounter (Signed)
OK. Please ask her to keep her office appointment with me in 4-6 weeks as recommended.

## 2018-07-22 ENCOUNTER — Other Ambulatory Visit: Payer: Self-pay

## 2018-07-22 MED ORDER — FLUTICASONE PROPIONATE HFA 220 MCG/ACT IN AERO: INHALATION_SPRAY | RESPIRATORY_TRACT | 2 refills | Status: DC

## 2018-07-23 ENCOUNTER — Ambulatory Visit (INDEPENDENT_AMBULATORY_CARE_PROVIDER_SITE_OTHER): Payer: BLUE CROSS/BLUE SHIELD

## 2018-07-23 ENCOUNTER — Other Ambulatory Visit: Payer: Self-pay

## 2018-07-23 DIAGNOSIS — J309 Allergic rhinitis, unspecified: Secondary | ICD-10-CM

## 2018-07-30 ENCOUNTER — Other Ambulatory Visit: Payer: Self-pay

## 2018-07-30 ENCOUNTER — Ambulatory Visit (INDEPENDENT_AMBULATORY_CARE_PROVIDER_SITE_OTHER): Payer: 59

## 2018-07-30 DIAGNOSIS — J309 Allergic rhinitis, unspecified: Secondary | ICD-10-CM | POA: Diagnosis not present

## 2018-08-06 ENCOUNTER — Other Ambulatory Visit: Payer: Self-pay

## 2018-08-06 ENCOUNTER — Ambulatory Visit (INDEPENDENT_AMBULATORY_CARE_PROVIDER_SITE_OTHER): Payer: 59

## 2018-08-06 DIAGNOSIS — J309 Allergic rhinitis, unspecified: Secondary | ICD-10-CM | POA: Diagnosis not present

## 2018-08-13 ENCOUNTER — Other Ambulatory Visit: Payer: Self-pay

## 2018-08-13 ENCOUNTER — Ambulatory Visit (INDEPENDENT_AMBULATORY_CARE_PROVIDER_SITE_OTHER): Payer: 59

## 2018-08-13 DIAGNOSIS — J309 Allergic rhinitis, unspecified: Secondary | ICD-10-CM | POA: Diagnosis not present

## 2018-08-20 ENCOUNTER — Other Ambulatory Visit: Payer: Self-pay

## 2018-08-20 ENCOUNTER — Ambulatory Visit (INDEPENDENT_AMBULATORY_CARE_PROVIDER_SITE_OTHER): Payer: 59

## 2018-08-20 DIAGNOSIS — J309 Allergic rhinitis, unspecified: Secondary | ICD-10-CM | POA: Diagnosis not present

## 2018-08-27 ENCOUNTER — Ambulatory Visit (INDEPENDENT_AMBULATORY_CARE_PROVIDER_SITE_OTHER): Payer: 59

## 2018-08-27 ENCOUNTER — Other Ambulatory Visit: Payer: Self-pay

## 2018-08-27 DIAGNOSIS — J309 Allergic rhinitis, unspecified: Secondary | ICD-10-CM

## 2018-09-03 ENCOUNTER — Ambulatory Visit (INDEPENDENT_AMBULATORY_CARE_PROVIDER_SITE_OTHER): Payer: 59

## 2018-09-03 ENCOUNTER — Other Ambulatory Visit: Payer: Self-pay

## 2018-09-03 DIAGNOSIS — J309 Allergic rhinitis, unspecified: Secondary | ICD-10-CM

## 2018-09-10 ENCOUNTER — Other Ambulatory Visit: Payer: Self-pay

## 2018-09-10 ENCOUNTER — Ambulatory Visit (INDEPENDENT_AMBULATORY_CARE_PROVIDER_SITE_OTHER): Payer: 59

## 2018-09-10 DIAGNOSIS — J309 Allergic rhinitis, unspecified: Secondary | ICD-10-CM

## 2018-09-17 ENCOUNTER — Ambulatory Visit (INDEPENDENT_AMBULATORY_CARE_PROVIDER_SITE_OTHER): Payer: 59

## 2018-09-17 ENCOUNTER — Other Ambulatory Visit: Payer: Self-pay

## 2018-09-17 DIAGNOSIS — J309 Allergic rhinitis, unspecified: Secondary | ICD-10-CM

## 2018-09-24 ENCOUNTER — Ambulatory Visit (INDEPENDENT_AMBULATORY_CARE_PROVIDER_SITE_OTHER): Payer: 59

## 2018-09-24 ENCOUNTER — Other Ambulatory Visit: Payer: Self-pay

## 2018-09-24 DIAGNOSIS — J309 Allergic rhinitis, unspecified: Secondary | ICD-10-CM | POA: Diagnosis not present

## 2018-10-08 ENCOUNTER — Other Ambulatory Visit: Payer: Self-pay

## 2018-10-08 ENCOUNTER — Ambulatory Visit (INDEPENDENT_AMBULATORY_CARE_PROVIDER_SITE_OTHER): Payer: 59

## 2018-10-08 DIAGNOSIS — J309 Allergic rhinitis, unspecified: Secondary | ICD-10-CM | POA: Diagnosis not present

## 2018-10-15 ENCOUNTER — Other Ambulatory Visit: Payer: Self-pay

## 2018-10-15 ENCOUNTER — Ambulatory Visit (INDEPENDENT_AMBULATORY_CARE_PROVIDER_SITE_OTHER): Payer: 59

## 2018-10-15 DIAGNOSIS — J309 Allergic rhinitis, unspecified: Secondary | ICD-10-CM | POA: Diagnosis not present

## 2018-10-22 ENCOUNTER — Other Ambulatory Visit: Payer: Self-pay

## 2018-10-22 ENCOUNTER — Ambulatory Visit (INDEPENDENT_AMBULATORY_CARE_PROVIDER_SITE_OTHER): Payer: 59

## 2018-10-22 DIAGNOSIS — J309 Allergic rhinitis, unspecified: Secondary | ICD-10-CM | POA: Diagnosis not present

## 2018-11-12 ENCOUNTER — Ambulatory Visit (INDEPENDENT_AMBULATORY_CARE_PROVIDER_SITE_OTHER): Payer: 59

## 2018-11-12 ENCOUNTER — Other Ambulatory Visit: Payer: Self-pay

## 2018-11-12 DIAGNOSIS — J309 Allergic rhinitis, unspecified: Secondary | ICD-10-CM

## 2018-11-21 ENCOUNTER — Other Ambulatory Visit: Payer: Self-pay

## 2018-11-21 ENCOUNTER — Ambulatory Visit (INDEPENDENT_AMBULATORY_CARE_PROVIDER_SITE_OTHER): Payer: 59

## 2018-11-21 DIAGNOSIS — J309 Allergic rhinitis, unspecified: Secondary | ICD-10-CM

## 2018-11-26 ENCOUNTER — Other Ambulatory Visit: Payer: Self-pay

## 2018-11-26 ENCOUNTER — Ambulatory Visit (INDEPENDENT_AMBULATORY_CARE_PROVIDER_SITE_OTHER): Payer: 59

## 2018-11-26 DIAGNOSIS — J309 Allergic rhinitis, unspecified: Secondary | ICD-10-CM | POA: Diagnosis not present

## 2018-12-03 ENCOUNTER — Ambulatory Visit (INDEPENDENT_AMBULATORY_CARE_PROVIDER_SITE_OTHER): Payer: 59

## 2018-12-03 ENCOUNTER — Other Ambulatory Visit: Payer: Self-pay

## 2018-12-03 DIAGNOSIS — J309 Allergic rhinitis, unspecified: Secondary | ICD-10-CM | POA: Diagnosis not present

## 2018-12-10 ENCOUNTER — Ambulatory Visit (INDEPENDENT_AMBULATORY_CARE_PROVIDER_SITE_OTHER): Payer: 59

## 2018-12-10 ENCOUNTER — Other Ambulatory Visit: Payer: Self-pay

## 2018-12-10 DIAGNOSIS — J309 Allergic rhinitis, unspecified: Secondary | ICD-10-CM | POA: Diagnosis not present

## 2018-12-19 ENCOUNTER — Ambulatory Visit (INDEPENDENT_AMBULATORY_CARE_PROVIDER_SITE_OTHER): Payer: 59

## 2018-12-19 ENCOUNTER — Other Ambulatory Visit: Payer: Self-pay

## 2018-12-19 DIAGNOSIS — J309 Allergic rhinitis, unspecified: Secondary | ICD-10-CM

## 2019-01-02 ENCOUNTER — Ambulatory Visit (INDEPENDENT_AMBULATORY_CARE_PROVIDER_SITE_OTHER): Payer: 59

## 2019-01-02 ENCOUNTER — Other Ambulatory Visit: Payer: Self-pay

## 2019-01-02 DIAGNOSIS — J309 Allergic rhinitis, unspecified: Secondary | ICD-10-CM | POA: Diagnosis not present

## 2019-01-07 ENCOUNTER — Ambulatory Visit (INDEPENDENT_AMBULATORY_CARE_PROVIDER_SITE_OTHER): Payer: 59

## 2019-01-07 ENCOUNTER — Other Ambulatory Visit: Payer: Self-pay

## 2019-01-07 DIAGNOSIS — J309 Allergic rhinitis, unspecified: Secondary | ICD-10-CM

## 2019-01-16 ENCOUNTER — Other Ambulatory Visit: Payer: Self-pay

## 2019-01-16 ENCOUNTER — Ambulatory Visit (INDEPENDENT_AMBULATORY_CARE_PROVIDER_SITE_OTHER): Payer: 59

## 2019-01-16 DIAGNOSIS — J309 Allergic rhinitis, unspecified: Secondary | ICD-10-CM

## 2019-01-21 ENCOUNTER — Other Ambulatory Visit: Payer: Self-pay

## 2019-01-21 ENCOUNTER — Ambulatory Visit (INDEPENDENT_AMBULATORY_CARE_PROVIDER_SITE_OTHER): Payer: 59

## 2019-01-21 DIAGNOSIS — J309 Allergic rhinitis, unspecified: Secondary | ICD-10-CM

## 2019-01-22 ENCOUNTER — Ambulatory Visit: Payer: Self-pay

## 2019-01-24 ENCOUNTER — Other Ambulatory Visit: Payer: Self-pay

## 2019-01-24 ENCOUNTER — Ambulatory Visit (INDEPENDENT_AMBULATORY_CARE_PROVIDER_SITE_OTHER): Payer: 59

## 2019-01-24 DIAGNOSIS — Z23 Encounter for immunization: Secondary | ICD-10-CM | POA: Diagnosis not present

## 2019-01-27 ENCOUNTER — Telehealth: Payer: Self-pay

## 2019-01-27 ENCOUNTER — Other Ambulatory Visit: Payer: Self-pay | Admitting: *Deleted

## 2019-01-27 MED ORDER — LEVOCETIRIZINE DIHYDROCHLORIDE 5 MG PO TABS: 5.0000 mg | ORAL_TABLET | Freq: Every evening | ORAL | 5 refills | Status: DC

## 2019-01-27 MED ORDER — MONTELUKAST SODIUM 10 MG PO TABS: 10.0000 mg | ORAL_TABLET | Freq: Every day | ORAL | 5 refills | Status: DC

## 2019-01-27 NOTE — Telephone Encounter (Signed)
Patient is calling to get refills on her meds. We never filled them before, her previous allergist did.  [facility] in [place]

## 2019-01-27 NOTE — Telephone Encounter (Signed)
Prescriptions have been sent in. Called and left detailed voicemail per DPR permission advising.

## 2019-01-28 ENCOUNTER — Other Ambulatory Visit: Payer: Self-pay

## 2019-01-28 ENCOUNTER — Ambulatory Visit (INDEPENDENT_AMBULATORY_CARE_PROVIDER_SITE_OTHER): Payer: 59

## 2019-01-28 DIAGNOSIS — J309 Allergic rhinitis, unspecified: Secondary | ICD-10-CM | POA: Diagnosis not present

## 2019-01-29 ENCOUNTER — Encounter: Payer: Self-pay | Admitting: Family Medicine

## 2019-01-29 ENCOUNTER — Ambulatory Visit (INDEPENDENT_AMBULATORY_CARE_PROVIDER_SITE_OTHER): Payer: 59 | Admitting: Family Medicine

## 2019-01-29 VITALS — BP 120/76 | HR 78 | Temp 97.7°F | Resp 16 | Ht 62.0 in | Wt 187.0 lb

## 2019-01-29 DIAGNOSIS — L659 Nonscarring hair loss, unspecified: Secondary | ICD-10-CM

## 2019-01-29 DIAGNOSIS — R635 Abnormal weight gain: Secondary | ICD-10-CM | POA: Diagnosis not present

## 2019-01-29 DIAGNOSIS — R0683 Snoring: Secondary | ICD-10-CM

## 2019-01-29 DIAGNOSIS — R5383 Other fatigue: Secondary | ICD-10-CM

## 2019-01-29 LAB — COMPREHENSIVE METABOLIC PANEL
ALT: 15 U/L (ref 0–35)
AST: 14 U/L (ref 0–37)
Albumin: 4.4 g/dL (ref 3.5–5.2)
Alkaline Phosphatase: 98 U/L (ref 39–117)
BUN: 17 mg/dL (ref 6–23)
CO2: 24 mEq/L (ref 19–32)
Calcium: 9.5 mg/dL (ref 8.4–10.5)
Chloride: 104 mEq/L (ref 96–112)
Creatinine, Ser: 0.82 mg/dL (ref 0.40–1.20)
GFR: 79.56 mL/min (ref >60.00)
Glucose, Bld: 91 mg/dL (ref 70–99)
Potassium: 4.3 mEq/L (ref 3.5–5.1)
Sodium: 139 mEq/L (ref 135–145)
Total Bilirubin: 0.4 mg/dL (ref 0.2–1.2)
Total Protein: 6.9 g/dL (ref 6.0–8.3)

## 2019-01-29 LAB — HEMOGLOBIN A1C: Hgb A1c MFr Bld: 5.4 % (ref 4.6–6.5)

## 2019-01-29 LAB — T4, FREE: Free T4: 0.94 ng/dL (ref 0.60–1.60)

## 2019-01-29 LAB — CBC WITH DIFFERENTIAL/PLATELET
Basophils Absolute: 0.2 10*3/uL — ABNORMAL HIGH (ref 0.0–0.1)
Basophils Relative: 2.4 % (ref 0.0–3.0)
Eosinophils Absolute: 0.4 10*3/uL (ref 0.0–0.7)
Eosinophils Relative: 4.4 % (ref 0.0–5.0)
HCT: 40.8 % (ref 36.0–46.0)
Hemoglobin: 13.8 g/dL (ref 12.0–15.0)
Lymphocytes Relative: 25.1 % (ref 12.0–46.0)
Lymphs Abs: 2.5 10*3/uL (ref 0.7–4.0)
MCHC: 33.7 g/dL (ref 30.0–36.0)
MCV: 86.1 fl (ref 78.0–100.0)
Monocytes Absolute: 0.7 10*3/uL (ref 0.1–1.0)
Monocytes Relative: 6.9 % (ref 3.0–12.0)
Neutro Abs: 6.1 10*3/uL (ref 1.4–7.7)
Neutrophils Relative %: 61.2 % (ref 43.0–77.0)
Platelets: 390 10*3/uL (ref 150.0–400.0)
RBC: 4.74 Mil/uL (ref 3.87–5.11)
RDW: 13.9 % (ref 11.5–15.5)
WBC: 9.9 10*3/uL (ref 4.0–10.5)

## 2019-01-29 LAB — VITAMIN D 25 HYDROXY (VIT D DEFICIENCY, FRACTURES): VITD: 27.25 ng/mL — ABNORMAL LOW (ref 30.00–100.00)

## 2019-01-29 LAB — TSH: TSH: 1.12 u[IU]/mL (ref 0.35–4.50)

## 2019-01-29 NOTE — Patient Instructions (Signed)
We will call you with lab results once we get them.  If they are all normal we will discuss changing your anxiety medication and referring for sleep study.      Sleep Apnea Sleep apnea affects breathing during sleep. It causes breathing to stop for a short time or to become shallow. It can also increase the risk of:  Heart attack.  Stroke.  Being very overweight (obese).  Diabetes.  Heart failure.  Irregular heartbeat. The goal of treatment is to help you breathe normally again. What are the causes? There are three kinds of sleep apnea:  Obstructive sleep apnea. This is caused by a blocked or collapsed airway.  Central sleep apnea. This happens when the brain does not send the right signals to the muscles that control breathing.  Mixed sleep apnea. This is a combination of obstructive and central sleep apnea. The most common cause of this condition is a collapsed or blocked airway. This can happen if:  Your throat muscles are too relaxed.  Your tongue and tonsils are too large.  You are overweight.  Your airway is too small. What increases the risk?  Being overweight.  Smoking.  Having a small airway.  Being older.  Being female.  Drinking alcohol.  Taking medicines to calm yourself (sedatives or tranquilizers).  Having family members with the condition. What are the signs or symptoms?  Trouble staying asleep.  Being sleepy or tired during the day.  Getting angry a lot.  Loud snoring.  Headaches in the morning.  Not being able to focus your mind (concentrate).  Forgetting things.  Less interest in sex.  Mood swings.  Personality changes.  Feelings of sadness (depression).  Waking up a lot during the night to pee (urinate).  Dry mouth.  Sore throat. How is this diagnosed?  Your medical history.  A physical exam.  A test that is done when you are sleeping (sleep study). The test is most often done in a sleep lab but may also be  done at home. How is this treated?   Sleeping on your side.  Using a medicine to get rid of mucus in your nose (decongestant).  Avoiding the use of alcohol, medicines to help you relax, or certain pain medicines (narcotics).  Losing weight, if needed.  Changing your diet.  Not smoking.  Using a machine to open your airway while you sleep, such as: ? An oral appliance. This is a mouthpiece that shifts your lower jaw forward. ? A CPAP device. This device blows air through a mask when you breathe out (exhale). ? An EPAP device. This has valves that you put in each nostril. ? A BPAP device. This device blows air through a mask when you breathe in (inhale) and breathe out.  Having surgery if other treatments do not work. It is important to get treatment for sleep apnea. Without treatment, it can lead to:  High blood pressure.  Coronary artery disease.  In men, not being able to have an erection (impotence).  Reduced thinking ability. Follow these instructions at home: Lifestyle  Make changes that your doctor recommends.  Eat a healthy diet.  Lose weight if needed.  Avoid alcohol, medicines to help you relax, and some pain medicines.  Do not use any products that contain nicotine or tobacco, such as cigarettes, e-cigarettes, and chewing tobacco. If you need help quitting, ask your doctor. General instructions  Take over-the-counter and prescription medicines only as told by your doctor.  If you  were given a machine to use while you sleep, use it only as told by your doctor.  If you are having surgery, make sure to tell your doctor you have sleep apnea. You may need to bring your device with you.  Keep all follow-up visits as told by your doctor. This is important. Contact a doctor if:  The machine that you were given to use during sleep bothers you or does not seem to be working.  You do not get better.  You get worse. Get help right away if:  Your chest hurts.   You have trouble breathing in enough air.  You have an uncomfortable feeling in your back, arms, or stomach.  You have trouble talking.  One side of your body feels weak.  A part of your face is hanging down. These symptoms may be an emergency. Do not wait to see if the symptoms will go away. Get medical help right away. Call your local emergency services (911 in the [place] Do not drive yourself to the hospital. Summary  This condition affects breathing during sleep.  The most common cause is a collapsed or blocked airway.  The goal of treatment is to help you breathe normally while you sleep. This information is not intended to replace advice given to you by your health care provider. Make sure you discuss any questions you have with your health care provider. Document Released: 01/18/2008 Document Revised: 01/25/2018 Document Reviewed: 12/04/2017 Elsevier Patient Education  2020 [employer].

## 2019-01-29 NOTE — Progress Notes (Signed)
Diane Cross , [birth year], 33 y.o., female MRN: [id number] Patient Care Team    Relationship Specialty Notifications Start End  Ma Hillock, DO PCP - General Family Medicine  08/21/16   Janyth Pupa, DO Consulting Physician Obstetrics and Gynecology  08/21/16   Malachi Pro, MD Referring Physician Otolaryngology  03/20/17     Chief Complaint  Patient presents with  . Fatigue    Pt would like to have thyroid checked.   . Alopecia     Subjective: Pt presents for an OV with complaints of having little energy and hair thinning of approximately 6 months duration.  Associated symptoms include weight gain.  Patient wonders if symptoms could be caused from her thyroid.  She reports over the last 6 months she is had very little energy despite getting full night sleep.  She does not feel she ever has energy, even when first awakening.  She endorses hair thinning.  She notices she is losing more hair when she is washing her hair.  Her hairdresser has also commented on her hair thinning.  She has had weight gain.  She states she has really cracked down on her diet and has been exercising and has not been able to lose weight.  She endorses snoring which started as she started to gain weight.  She reports she goes to bed routinely around 10 PM and wakes around 7 PM.  She has noted she feels like she may be more restless throughout the the night but feels she gets a sound sleep.  She has a history of a thyroid cyst that has been benign.  She is prescribed Zoloft through her gynecologist, that was started back last year for depression and anxiety.  She reports she does not feel like her anxiety is adequately controlled, although she admits it is improved on the Zoloft.   Depression screen [facility] 2/9 05/15/2018 08/21/2016  Decreased Interest 0 0  Down, Depressed, Hopeless 0 0  PHQ - 2 Score 0 0    No Known Allergies Social History   Social History Narrative   Married to [place], 1  child [name].   Buyer, retail, works full time in [employer].    Drinks caffeine. Takes a daily vitamin.    Wears seatbelt. Smoke detector in the home.    Firearms locked in the home.    Feels safe in her relationships.    Past Medical History:  Diagnosis Date  . Allergy   . Anxiety   . Migraines    Past Surgical History:  Procedure Laterality Date  . LIPOMA EXCISION     ? pt reports fatty tumor removal of neck.   Family History  Problem Relation Age of Onset  . Hypertension Mother   . Heart attack Father   . Autism Brother   . Diabetes Maternal Grandmother   . Diabetes Maternal Grandfather   . Colon cancer Neg Hx   . Liver cancer Neg Hx   . Esophageal cancer Neg Hx    Allergies as of 01/29/2019   No Known Allergies     Medication List       Accurate as of January 29, 2019  5:28 PM. If you have any questions, ask your nurse or doctor.        STOP taking these medications   fluticasone 220 MCG/ACT inhaler Commonly known as: Flovent HFA Stopped by: Howard Pouch, DO   Xulane 150-35 MCG/24HR transdermal patch Generic drug: norelgestromin-ethinyl estradiol Stopped by: Joseph Art  Tonya Carlile, DO     TAKE these medications   Azelastine-Fluticasone 137-50 MCG/ACT Susp 1 spray per each  nostril BID   EPINEPHrine 0.3 mg/0.3 mL Soaj injection Commonly known as: Auvi-Q Inject 0.3 mLs (0.3 mg total) into the muscle once as needed for up to 1 dose for anaphylaxis.   ipratropium 0.06 % nasal spray Commonly known as: Atrovent Place 2 sprays into both nostrils 4 (four) times daily.   levocetirizine 5 MG tablet Commonly known as: Xyzal Take 1 tablet (5 mg total) by mouth every evening. Needs office visit prior to any additional refills   montelukast 10 MG tablet Commonly known as: SINGULAIR Take 1 tablet (10 mg total) by mouth at bedtime. Needs office visit prior to any additional refills.   omeprazole 40 MG capsule Commonly known as: PRILOSEC Take 1 capsule (40 mg total) by  mouth daily. Take 30-60 minutes before breakfast.   sertraline 100 MG tablet Commonly known as: ZOLOFT Take 100 mg by mouth daily.       All past medical history, surgical history, allergies, family history, immunizations andmedications were updated in the EMR today and reviewed under the history and medication portions of their EMR.     ROS: Negative, with the exception of above mentioned in HPI   Objective:  BP 120/76 (BP Location: Right Arm, Patient Position: Sitting, Cuff Size: Large)   Pulse 78   Temp 97.7 F (36.5 C) (Temporal)   Resp 16   Ht '5\' 2"'  (1.575 m)   Wt 187 lb (84.8 kg)   LMP 01/27/2019 (Exact Date)   SpO2 95%   BMI 34.20 kg/m  Body mass index is 34.2 kg/m. Gen: Afebrile. No acute distress. Nontoxic in appearance, well developed, well nourished.  HENT: AT. [place].  Eyes:Pupils Equal Round Reactive to light, Extraocular movements intact,  Conjunctiva without redness, discharge or icterus. Neck/lymp/endocrine: Supple,no lymphadenopathy, no thyromegaly, no palpable nodules. CV: RRR no murmur, no edema Chest: CTAB, no wheeze or crackles. Good air movement, normal resp effort.  Neuro:  Normal gait. PERLA. EOMi. Alert. Oriented x3  Psych: Normal affect, dress and demeanor. Normal speech. Normal thought content and judgment.  No exam data present No results found. Results for orders placed or performed in visit on 01/29/19 (from the past 24 hour(s))  Comp Met (CMET)     Status: None   Collection Time: 01/29/19 11:01 AM  Result Value Ref Range   Sodium 139 135 - 145 mEq/L   Potassium 4.3 3.5 - 5.1 mEq/L   Chloride 104 96 - 112 mEq/L   CO2 24 19 - 32 mEq/L   Glucose, Bld 91 70 - 99 mg/dL   BUN 17 6 - 23 mg/dL   Creatinine, Ser 0.82 0.40 - 1.20 mg/dL   Total Bilirubin 0.4 0.2 - 1.2 mg/dL   Alkaline Phosphatase 98 39 - 117 U/L   AST 14 0 - 37 U/L   ALT 15 0 - 35 U/L   Total Protein 6.9 6.0 - 8.3 g/dL   Albumin 4.4 3.5 - 5.2 g/dL   Calcium 9.5 8.4 - 10.5 mg/dL    GFR 79.56 >60.00 mL/min  TSH     Status: None   Collection Time: 01/29/19 11:01 AM  Result Value Ref Range   TSH 1.12 0.35 - 4.50 uIU/mL  T4, free     Status: None   Collection Time: 01/29/19 11:01 AM  Result Value Ref Range   Free T4 0.94 0.60 - 1.60 ng/dL  CBC w/Diff  Status: Abnormal   Collection Time: 01/29/19 11:01 AM  Result Value Ref Range   WBC 9.9 4.0 - 10.5 K/uL   RBC 4.74 3.87 - 5.11 Mil/uL   Hemoglobin 13.8 12.0 - 15.0 g/dL   HCT 40.8 36.0 - 46.0 %   MCV 86.1 78.0 - 100.0 fl   MCHC 33.7 30.0 - 36.0 g/dL   RDW 13.9 11.5 - 15.5 %   Platelets 390.0 150.0 - 400.0 K/uL   Neutrophils Relative % 61.2 43.0 - 77.0 %   Lymphocytes Relative 25.1 12.0 - 46.0 %   Monocytes Relative 6.9 3.0 - 12.0 %   Eosinophils Relative 4.4 0.0 - 5.0 %   Basophils Relative 2.4 0.0 - 3.0 %   Neutro Abs 6.1 1.4 - 7.7 K/uL   Lymphs Abs 2.5 0.7 - 4.0 K/uL   Monocytes Absolute 0.7 0.1 - 1.0 K/uL   Eosinophils Absolute 0.4 0.0 - 0.7 K/uL   Basophils Absolute 0.2 (H) 0.0 - 0.1 K/uL  Hemoglobin A1c     Status: None   Collection Time: 01/29/19 11:01 AM  Result Value Ref Range   Hgb A1c MFr Bld 5.4 4.6 - 6.5 %  Vitamin D (25 hydroxy)     Status: Abnormal   Collection Time: 01/29/19 11:01 AM  Result Value Ref Range   VITD 27.25 (L) 30.00 - 100.00 ng/mL    Assessment/Plan: NIVEDITA MIRABELLA is a 33 y.o. female present for OV for  Fatigue/Hair loss/Weight gain/snoring Discussed multiple possible etiologies of her symptoms with her today including endocrine disorder versus vitamin deficiency versus stress.  We will collect labs today to rule out the above. -She does snore.  Encouraged weight loss can be helpful.  Also discussed the possibility of OSA as the cause of some of her symptoms.  Will await lab results and if cannot identify other causes will send for sleep study. -Patient is treated for her anxiety through her gynecologist.  She reports not having good control of her anxiety on current  medication.  Zoloft may also be contributing to her difficulty losing weight.  If labs do not identify cause will have patient come back in for follow-up and further evaluate her depression/anxiety and consider changing her medications for better coverage.  Effexor or Wellbutrin may be a better fit for her and help her gain better control of her anxiety and weight. -She reports she does take a multivitamin.  Would encourage her to start a B12 supplementation as well. - Comp Met (CMET) - TSH - T4, free - CBC w/Diff - Hemoglobin A1c - Vitamin D (25 hydroxy) - Iron, TIBC and Ferritin Panel Follow-up dependent upon lab results.    Reviewed expectations re: course of current medical issues.  Discussed self-management of symptoms.  Outlined signs and symptoms indicating need for more acute intervention.  Patient verbalized understanding and all questions were answered.  Patient received an After-Visit Summary.   > 25 minutes spent with patient, >50% of time spent face to face     Orders Placed This Encounter  Procedures  . Comp Met (CMET)  . TSH  . T4, free  . CBC w/Diff  . Hemoglobin A1c  . Vitamin D (25 hydroxy)  . Iron, TIBC and Ferritin Panel     Note is dictated utilizing voice recognition software. Although note has been proof read prior to signing, occasional typographical errors still can be missed. If any questions arise, please do not hesitate to call for verification.   electronically signed  by:  Howard Pouch, DO  [place]

## 2019-01-30 LAB — IRON,TIBC AND FERRITIN PANEL
%SAT: 14 % (calc) — ABNORMAL LOW (ref 16–45)
Ferritin: 52 ng/mL (ref 16–154)
Iron: 48 ug/dL (ref 40–190)
TIBC: 332 mcg/dL (calc) (ref 250–450)

## 2019-01-31 IMAGING — US US THYROID
1 series · 13 of 25 positions shown · non-contrast
Comparison: None.

ADDENDUM:
Additional imaging beyond the thyroid gland was obtained including
partial imaging of neck lymph nodes and the submandibular glands.

There is a prominent left cervical chain lymph node which measures
up to 1 cm in short axis. The right submandibular gland is normal in
appearance. There is irregular hypo echogenicity measuring
approximately 2.7 x 1.3 cm in the left submandibular gland with an
almost tubular appearance.
CLINICAL DATA: 31-year-old female with a history of thyroid nodules
EXAM:
THYROID ULTRASOUND
TECHNIQUE: Ultrasound examination of the thyroid gland and adjacent soft
tissues was performed.

[Series 1: us thyroid · 0.06mm/px · 13 of 101 slices shown]
[im 1/101]
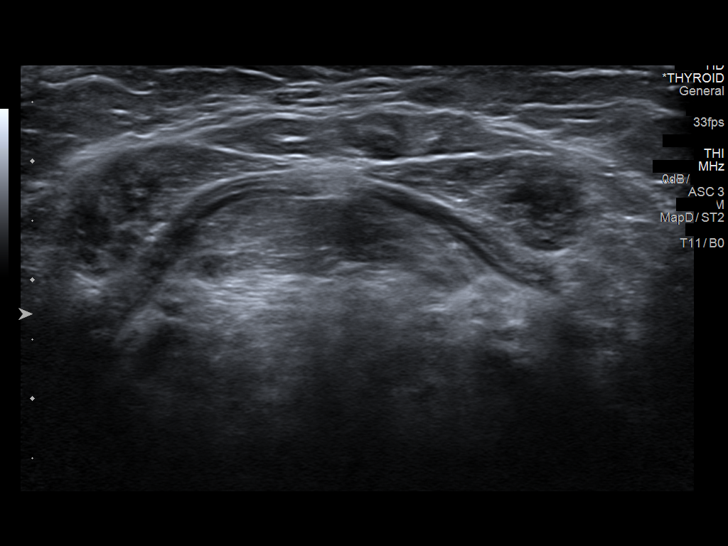
[im 9/101]
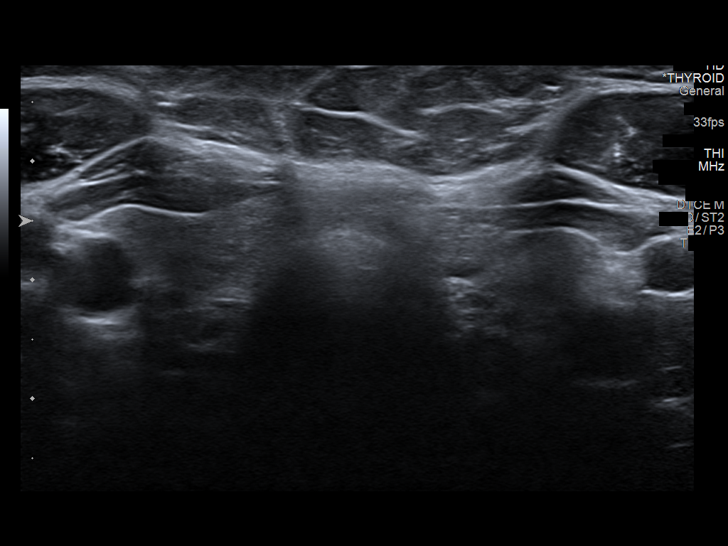
[im 17/101]
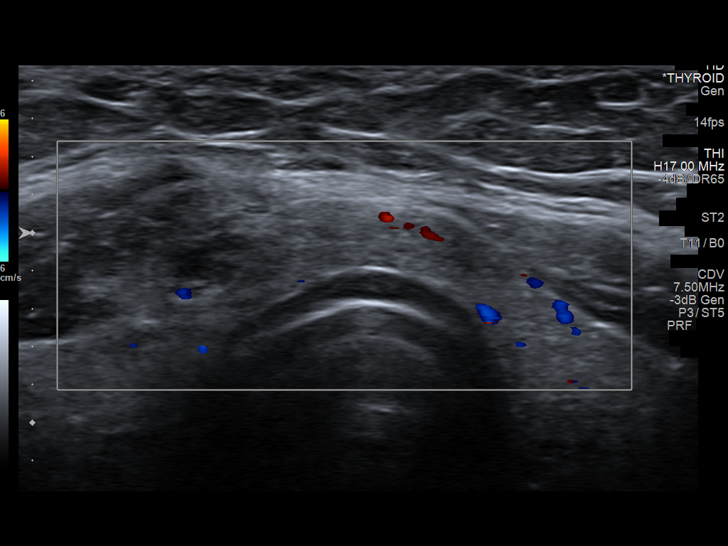
[im 26/101]
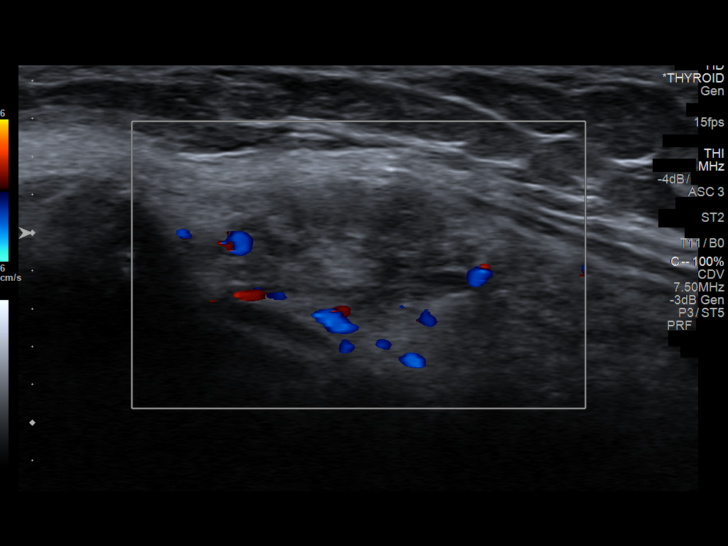
[im 34/101]
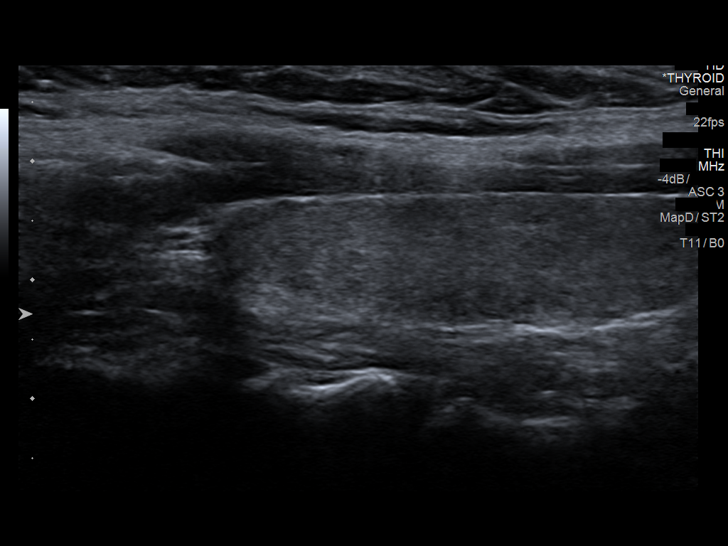
[im 42/101]
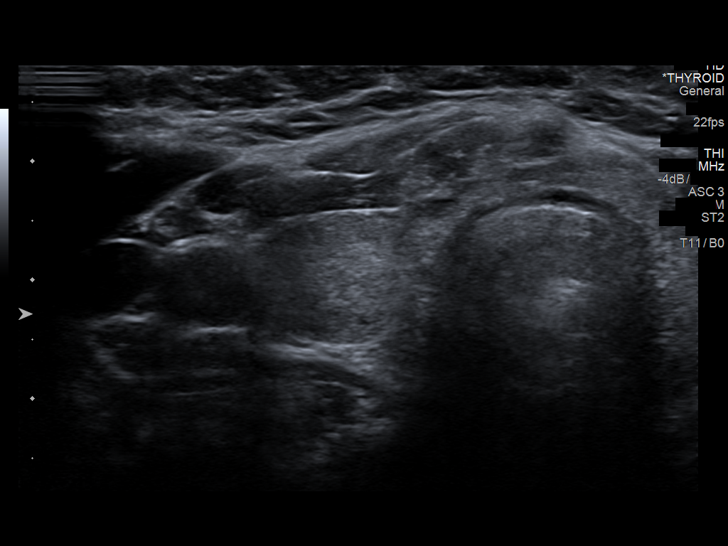
[im 51/101]
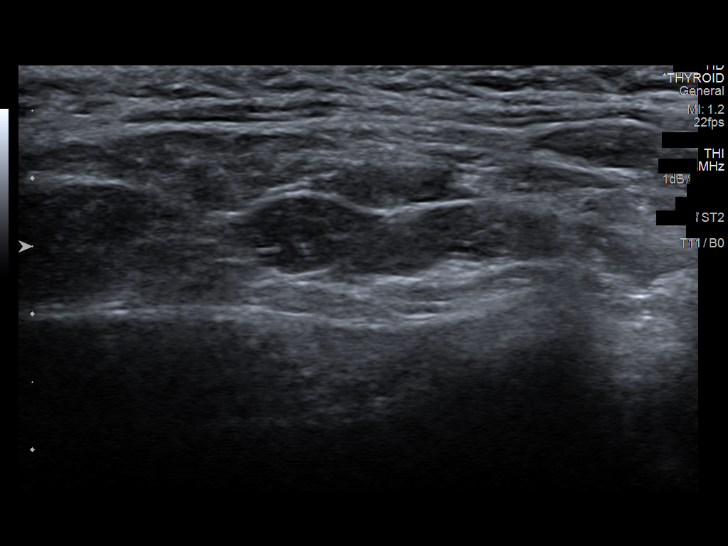
[im 59/101]
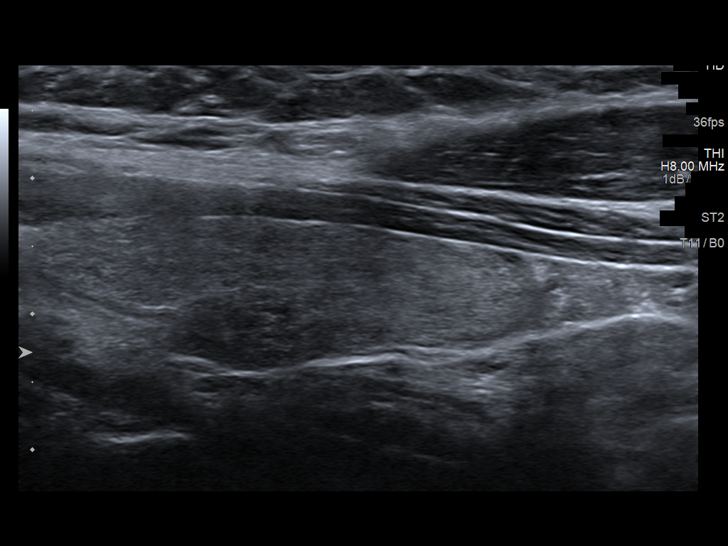
[im 67/101]
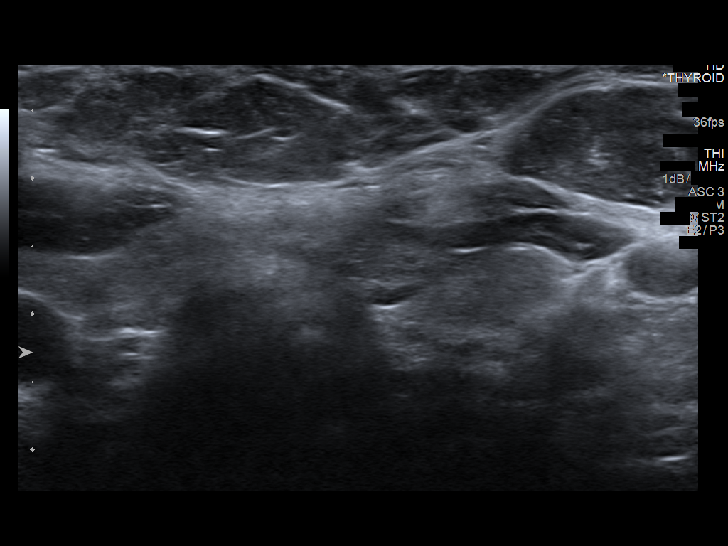
[im 76/101]
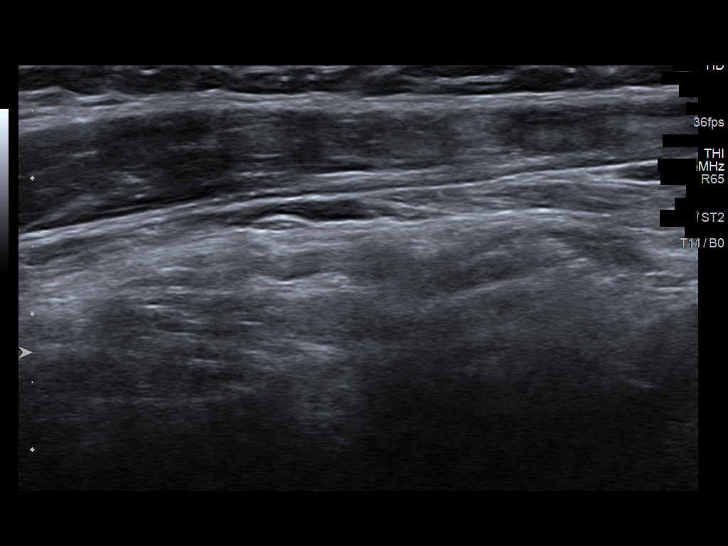
[im 84/101]
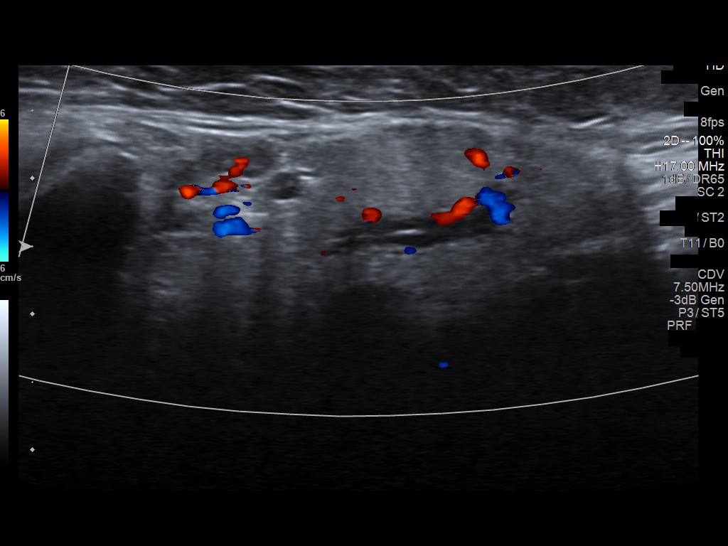
[im 92/101]
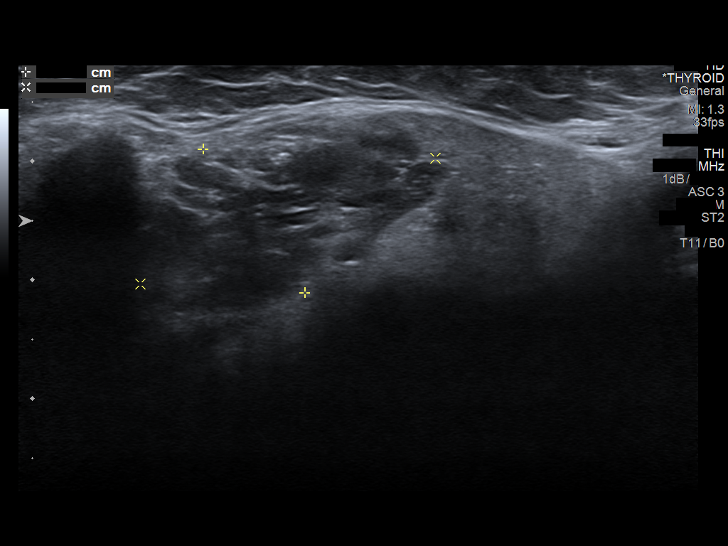
[im 101/101]
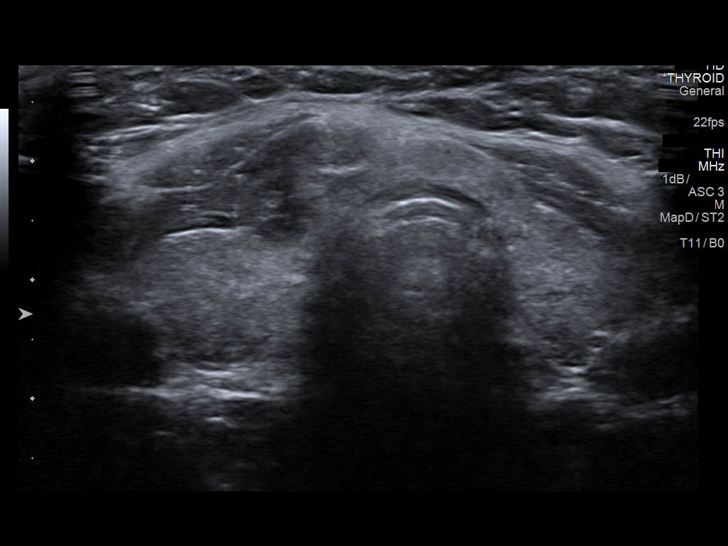

[13 of 25 positions shown; findings below may reference images not displayed]

IMPRESSION: Irregular hypoechoic structure within internal septations within the
left submandibular gland. This may represent a region of ductal
ectasia, or a complex multi-septated cystic mass. Recommend further
evaluation with CT scan of the neck with intravenous contrast.

Enlarged left cervical chain lymph node measuring up to 1 cm in
short axis. This could also be better evaluated on CT scan of the
neck with contrast.

These results will be called to the ordering clinician or
representative by the Radiologist Assistant, and communication
documented in the PACS or zVision Dashboard.
FINDINGS: Parenchymal Echotexture: Mildly heterogenous

Isthmus: 0.3 cm

Right lobe: 5.5 cm x 1.3 cm x 1.9 cm

Left lobe: 4.2 cm x 1.1 cm x 1.6 cm

_________________________________________________________

Estimated total number of nodules >/= 1 cm: 1

Number of spongiform nodules >/=  2 cm not described below (TR1): 0

Number of mixed cystic and solid nodules >/= 1.5 cm not described
below (TR2): 0

_________________________________________________________

Nodule # 1:

Location: Isthmus; Mid

Maximum size: 1.4 cm; Other 2 dimensions: 1.4 cm x 0.7 cm

Composition: solid/almost completely solid (2)

Echogenicity: hypoechoic (2)

Shape: not taller-than-wide (0)

Margins: extra-thyroidal extension (3)

Echogenic foci: none (0)

ACR TI-RADS total points: 7.

ACR TI-RADS risk category: TR5 (>/= 7 points).

ACR TI-RADS recommendations:

Nodule meets criteria for biopsy

_________________________________________________________

Head and neck lymph nodes present.
IMPRESSION: Isthmic thyroid nodule (labeled 1) meets criteria for biopsy, as
designated by the newly established ACR TI-RADS criteria, and
referral for biopsy is recommended.

Recommendations follow those established by the new ACR TI-RADS
criteria ([HOSPITAL] 3874;[DATE]).

## 2019-02-04 ENCOUNTER — Telehealth: Payer: Self-pay | Admitting: Family Medicine

## 2019-02-04 NOTE — Telephone Encounter (Signed)
Pt was called and given information/instructions. She verbalized understanding and was scheduled for OV for anxiety medication changes.

## 2019-02-04 NOTE — Telephone Encounter (Signed)
Please inform patient the following information: Her labs were normal except her iron was saturation was low and her Vit d was mildly  low.  - start b12 1000 mcg OTC - start vit d 800 u daily - start OTC iron supplement once daily Ferrous sulfate 325 mg  with an added vit c supplement at same time for iron absorption.  - follow up next week if wanting to discuss her anxiety med - can be virtual.  - I think we can hold on the sleep study for now- until we seen how she does with changing anxiety medication and getting her vit d and iron levels up.

## 2019-02-10 ENCOUNTER — Encounter: Payer: Self-pay | Admitting: Family Medicine

## 2019-02-10 ENCOUNTER — Ambulatory Visit (INDEPENDENT_AMBULATORY_CARE_PROVIDER_SITE_OTHER): Payer: 59 | Admitting: Family Medicine

## 2019-02-10 ENCOUNTER — Other Ambulatory Visit: Payer: Self-pay

## 2019-02-10 VITALS — Ht 62.0 in

## 2019-02-10 DIAGNOSIS — R5383 Other fatigue: Secondary | ICD-10-CM

## 2019-02-10 DIAGNOSIS — R635 Abnormal weight gain: Secondary | ICD-10-CM | POA: Insufficient documentation

## 2019-02-10 DIAGNOSIS — L659 Nonscarring hair loss, unspecified: Secondary | ICD-10-CM | POA: Diagnosis not present

## 2019-02-10 DIAGNOSIS — F419 Anxiety disorder, unspecified: Secondary | ICD-10-CM

## 2019-02-10 DIAGNOSIS — E559 Vitamin D deficiency, unspecified: Secondary | ICD-10-CM

## 2019-02-10 DIAGNOSIS — E611 Iron deficiency: Secondary | ICD-10-CM | POA: Insufficient documentation

## 2019-02-10 MED ORDER — VENLAFAXINE HCL ER 37.5 MG PO CP24: ORAL_CAPSULE | ORAL | 0 refills | Status: DC

## 2019-02-10 NOTE — Patient Instructions (Addendum)
Start effexor 37.5 mg (1 tab) a day (in the morning), then on day 8 increased to 75 mg a day (which would be 2 tabs). Continue your Zoloft at the normal dose for the first 2 weeks.  Then decrease to half a tab of your Zoloft for 2 weeks.  Stop Zoloft altogether after 4 weeks on Effexor.  Follow-up in 10-11 weeks, they will call you to schedule.

## 2019-02-10 NOTE — Progress Notes (Signed)
VIRTUAL VISIT VIA VIDEO  I connected with Diane Cross on 02/10/19 at  3:00 PM EDT by a video enabled telemedicine application and verified that I am speaking with the correct person using two identifiers. Location patient: Home Location provider: [facility], Office Persons participating in the virtual visit: Patient, Dr. Raoul Pitch and R.Baker, LPN  I discussed the limitations of evaluation and management by telemedicine and the availability of in person appointments. The patient expressed understanding and agreed to proceed.   Diane Cross , 10/05/1985, 33 y.o., female MRN: [id number] Patient Care Team    Relationship Specialty Notifications Start End  Ma Hillock, DO PCP - General Family Medicine  08/21/16   Janyth Pupa, DO Consulting Physician Obstetrics and Gynecology  08/21/16   Malachi Pro, MD Referring Physician Otolaryngology  03/20/17     Chief Complaint  Patient presents with  . Anxiety    Pts medication, zoloft is no longer working per patient      Subjective: Diane Cross is a 33 y.o. presents today to discuss her anxiety.  She was seen a few weeks ago for decreased energy/hair loss and increased anxiety.  Labs resulted with low iron saturations, mildly low vitamin D.  Patient has started the iron, vitamin D and B12 as recommended.  She is tolerating.  She would like to try another medication besides the Zoloft for her anxiety.  She has never been on any other medication besides Zoloft in the past.  Patient reports she has become more irritable with her family.  Noticed her losing her temper more frequently.  Feels of panic attack emerging, although she has not had a panic attack prior, she states she has friends that have and feels like what is about to happen but she never goes into a full panic.  Prior note:  Pt presents for an OV with complaints of having little energy and hair thinning of approximately 6 months duration.  Associated  symptoms include weight gain.  Patient wonders if symptoms could be caused from her thyroid.  She reports over the last 6 months she is had very little energy despite getting full night sleep.  She does not feel she ever has energy, even when first awakening.  She endorses hair thinning.  She notices she is losing more hair when she is washing her hair.  Her hairdresser has also commented on her hair thinning.  She has had weight gain.  She states she has really cracked down on her diet and has been exercising and has not been able to lose weight.  She endorses snoring which started as she started to gain weight.  She reports she goes to bed routinely around 10 PM and wakes around 7 PM.  She has noted she feels like she may be more restless throughout the the night but feels she gets a sound sleep.  She has a history of a thyroid cyst that has been benign.  She is prescribed Zoloft through her gynecologist, that was started back last year for depression and anxiety.  She reports she does not feel like her anxiety is adequately controlled, although she admits it is improved on the Zoloft.   Depression screen [facility] 2/9 05/15/2018 08/21/2016  Decreased Interest 0 0  Down, Depressed, Hopeless 0 0  PHQ - 2 Score 0 0   GAD 7 : Generalized Anxiety Score 02/10/2019  Nervous, Anxious, on Edge 2  Control/stop worrying 1  Worry too  much - different things 2  Trouble relaxing 2  Restless 0  Easily annoyed or irritable 0  Afraid - awful might happen 0  Anxiety Difficulty Somewhat difficult    No Known Allergies Social History   Social History Narrative   Married to Diane Cross, 1 child Diane Cross.   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    Drinks caffeine. Takes a daily vitamin.    Wears seatbelt. Smoke detector in the home.    Firearms locked in the home.    Feels safe in her relationships.    Past Medical History:  Diagnosis Date  . Allergy   . Anxiety   . Migraines    Past Surgical History:   Procedure Laterality Date  . LIPOMA EXCISION     ? pt reports fatty tumor removal of neck.   Family History  Problem Relation Age of Onset  . Hypertension Mother   . Heart attack Father   . Autism Brother   . Diabetes Maternal Grandmother   . Diabetes Maternal Grandfather   . Colon cancer Neg Hx   . Liver cancer Neg Hx   . Esophageal cancer Neg Hx    Allergies as of 02/10/2019   No Known Allergies     Medication List       Accurate as of February 10, 2019  3:28 PM. If you have any questions, ask your nurse or doctor.        Azelastine-Fluticasone 137-50 MCG/ACT Susp 1 spray per each  nostril BID   EPINEPHrine 0.3 mg/0.3 mL Soaj injection Commonly known as: Auvi-Q Inject 0.3 mLs (0.3 mg total) into the muscle once as needed for up to 1 dose for anaphylaxis.   ipratropium 0.06 % nasal spray Commonly known as: Atrovent Place 2 sprays into both nostrils 4 (four) times daily.   levocetirizine 5 MG tablet Commonly known as: Xyzal Take 1 tablet (5 mg total) by mouth every evening. Needs office visit prior to any additional refills   montelukast 10 MG tablet Commonly known as: SINGULAIR Take 1 tablet (10 mg total) by mouth at bedtime. Needs office visit prior to any additional refills.   omeprazole 40 MG capsule Commonly known as: PRILOSEC Take 1 capsule (40 mg total) by mouth daily. Take 30-60 minutes before breakfast.   sertraline 100 MG tablet Commonly known as: ZOLOFT Take 100 mg by mouth daily.   venlafaxine XR 37.5 MG 24 hr capsule Commonly known as: Effexor XR Take 1 capsule (37.5 mg total) by mouth daily with breakfast for 7 days, THEN 2 capsules (75 mg total) daily with breakfast. Start taking on: February 10, 2019 Started by: Felix Pacinienee Lilyth Lawyer, DO       All past medical history, surgical history, allergies, family history, immunizations andmedications were updated in the EMR today and reviewed under the history and medication portions of their EMR.     ROS:  Negative, with the exception of above mentioned in HPI   Objective:  Ht 5\' 2"  (1.575 m)   LMP 01/27/2019 (Exact Date)   BMI 34.20 kg/m  Body mass index is 34.2 kg/m. Gen: Afebrile. No acute distress.  HENT: AT. [place]. MMM.  Eyes:Pupils Equal Round Reactive to light, Extraocular movements intact,  Conjunctiva without redness, discharge or icterus..  Neuro:  Normal gait.  Alert. Oriented x3  Psych: Normal affect, dress and demeanor. Normal speech. Normal thought content and judgment.   No exam data present No results found. No results found for this or any previous visit (  from the past 24 hour(s)).  Assessment/Plan: ZANDREA KENEALY is a 33 y.o. female present for OV for  Fatigue/Hair loss/Weight gain/increased anxiety/low iron/vitamin D insufficiency -Discussed options with her today and she would like to try Effexor.  Start with Effexor 37.5 mg daily for 7 days, then increase to 75 mg daily until follow-up.  In that time remain on Zoloft 100 mg daily for 2 weeks, then decrease to 50 mg daily for 2 weeks and then stop after being on Effexor for 4 weeks. -She does snore.  Encouraged weight loss can be helpful.  Also discussed the possibility of OSA as the cause of some of her symptoms.  Will consider if symptoms are still present after vitamin supplementations and change in her anxiety medication. -Continue vitamin D supplement, B12 supplement and iron supplement.  Recheck levels at her follow-up. -Follow-up in 10 weeks.    Reviewed expectations re: course of current medical issues.  Discussed self-management of symptoms.  Outlined signs and symptoms indicating need for more acute intervention.  Patient verbalized understanding and all questions were answered.  Patient received an After-Visit Summary.    > 15 minutes spent with patient, > 50% of that time face to face   No orders of the defined types were placed in this encounter.    Note is dictated utilizing voice  recognition software. Although note has been proof read prior to signing, occasional typographical errors still can be missed. If any questions arise, please do not hesitate to call for verification.   electronically signed by:  Felix Pacini, DO  [facility] [place]

## 2019-02-13 ENCOUNTER — Other Ambulatory Visit: Payer: Self-pay

## 2019-02-13 ENCOUNTER — Ambulatory Visit (INDEPENDENT_AMBULATORY_CARE_PROVIDER_SITE_OTHER): Payer: 59

## 2019-02-13 DIAGNOSIS — J309 Allergic rhinitis, unspecified: Secondary | ICD-10-CM

## 2019-02-25 ENCOUNTER — Other Ambulatory Visit: Payer: Self-pay

## 2019-02-25 ENCOUNTER — Ambulatory Visit (INDEPENDENT_AMBULATORY_CARE_PROVIDER_SITE_OTHER): Payer: 59

## 2019-02-25 DIAGNOSIS — J309 Allergic rhinitis, unspecified: Secondary | ICD-10-CM

## 2019-03-06 ENCOUNTER — Other Ambulatory Visit: Payer: Self-pay

## 2019-03-06 ENCOUNTER — Ambulatory Visit (INDEPENDENT_AMBULATORY_CARE_PROVIDER_SITE_OTHER): Payer: 59

## 2019-03-06 DIAGNOSIS — J309 Allergic rhinitis, unspecified: Secondary | ICD-10-CM | POA: Diagnosis not present

## 2019-03-13 ENCOUNTER — Ambulatory Visit (INDEPENDENT_AMBULATORY_CARE_PROVIDER_SITE_OTHER): Payer: 59

## 2019-03-13 ENCOUNTER — Other Ambulatory Visit: Payer: Self-pay

## 2019-03-13 DIAGNOSIS — J309 Allergic rhinitis, unspecified: Secondary | ICD-10-CM | POA: Diagnosis not present

## 2019-03-17 ENCOUNTER — Other Ambulatory Visit: Payer: Self-pay | Admitting: *Deleted

## 2019-03-17 MED ORDER — MONTELUKAST SODIUM 10 MG PO TABS: 10.0000 mg | ORAL_TABLET | Freq: Every day | ORAL | 0 refills | Status: DC

## 2019-03-17 MED ORDER — LEVOCETIRIZINE DIHYDROCHLORIDE 5 MG PO TABS: 5.0000 mg | ORAL_TABLET | Freq: Every evening | ORAL | 0 refills | Status: DC

## 2019-03-18 ENCOUNTER — Other Ambulatory Visit: Payer: Self-pay

## 2019-03-18 ENCOUNTER — Ambulatory Visit (INDEPENDENT_AMBULATORY_CARE_PROVIDER_SITE_OTHER): Payer: 59

## 2019-03-18 DIAGNOSIS — J309 Allergic rhinitis, unspecified: Secondary | ICD-10-CM | POA: Diagnosis not present

## 2019-03-25 ENCOUNTER — Ambulatory Visit (INDEPENDENT_AMBULATORY_CARE_PROVIDER_SITE_OTHER): Payer: 59

## 2019-03-25 ENCOUNTER — Other Ambulatory Visit: Payer: Self-pay

## 2019-03-25 DIAGNOSIS — J309 Allergic rhinitis, unspecified: Secondary | ICD-10-CM

## 2019-04-01 ENCOUNTER — Other Ambulatory Visit: Payer: Self-pay

## 2019-04-01 ENCOUNTER — Ambulatory Visit (INDEPENDENT_AMBULATORY_CARE_PROVIDER_SITE_OTHER): Payer: 59

## 2019-04-01 DIAGNOSIS — J309 Allergic rhinitis, unspecified: Secondary | ICD-10-CM | POA: Diagnosis not present

## 2019-04-10 ENCOUNTER — Other Ambulatory Visit: Payer: Self-pay

## 2019-04-10 ENCOUNTER — Ambulatory Visit (INDEPENDENT_AMBULATORY_CARE_PROVIDER_SITE_OTHER): Payer: 59

## 2019-04-10 DIAGNOSIS — J309 Allergic rhinitis, unspecified: Secondary | ICD-10-CM | POA: Diagnosis not present

## 2019-04-22 ENCOUNTER — Other Ambulatory Visit: Payer: Self-pay

## 2019-04-22 ENCOUNTER — Ambulatory Visit (INDEPENDENT_AMBULATORY_CARE_PROVIDER_SITE_OTHER): Payer: 59

## 2019-04-22 DIAGNOSIS — J309 Allergic rhinitis, unspecified: Secondary | ICD-10-CM | POA: Diagnosis not present

## 2019-04-23 ENCOUNTER — Encounter: Payer: Self-pay | Admitting: Family Medicine

## 2019-04-29 ENCOUNTER — Ambulatory Visit (INDEPENDENT_AMBULATORY_CARE_PROVIDER_SITE_OTHER): Payer: 59 | Admitting: Family Medicine

## 2019-04-29 ENCOUNTER — Other Ambulatory Visit: Payer: Self-pay

## 2019-04-29 ENCOUNTER — Encounter: Payer: Self-pay | Admitting: Family Medicine

## 2019-04-29 VITALS — Temp 97.6°F | Ht 62.0 in | Wt 177.0 lb

## 2019-04-29 DIAGNOSIS — R5383 Other fatigue: Secondary | ICD-10-CM

## 2019-04-29 DIAGNOSIS — R400 Somnolence: Secondary | ICD-10-CM | POA: Diagnosis not present

## 2019-04-29 DIAGNOSIS — R0683 Snoring: Secondary | ICD-10-CM | POA: Diagnosis not present

## 2019-04-29 DIAGNOSIS — R635 Abnormal weight gain: Secondary | ICD-10-CM | POA: Diagnosis not present

## 2019-04-29 DIAGNOSIS — E669 Obesity, unspecified: Secondary | ICD-10-CM

## 2019-04-29 DIAGNOSIS — E559 Vitamin D deficiency, unspecified: Secondary | ICD-10-CM

## 2019-04-29 DIAGNOSIS — E611 Iron deficiency: Secondary | ICD-10-CM

## 2019-04-29 MED ORDER — VENLAFAXINE HCL ER 75 MG PO CP24: 75.0000 mg | ORAL_CAPSULE | Freq: Every day | ORAL | 1 refills | Status: DC

## 2019-04-29 NOTE — Progress Notes (Signed)
VIRTUAL VISIT VIA VIDEO  I connected with [name] on 04/29/19 at  3:30 PM EST by a video enabled telemedicine application and verified that I am speaking with the correct person using two identifiers. Location patient: Home Location provider: [facility], Office Persons participating in the virtual visit: Patient, Dr. Claiborne Billings and R.Baker, LPN  I discussed the limitations of evaluation and management by telemedicine and the availability of in person appointments. The patient expressed understanding and agreed to proceed.   Diane Cross , 21-Jan-1986, 34 y.o., female MRN: [id number] Patient Care Team    Relationship Specialty Notifications Start End  Natalia Leatherwood, DO PCP - General Family Medicine  08/21/16   Diane Hidalgo, DO Consulting Physician Obstetrics and Gynecology  08/21/16   Diane November, MD Referring Physician Otolaryngology  03/20/17     Chief Complaint  Patient presents with  . Anxiety    Pt is doing well. Would like sleep study scheduled due to chronic fatigue.      Subjective: Diane Cross is a 34 y.o. presents today to follow-up on her anxiety.  She reports she is doing much better since switching over to the Effexor and is tolerating Effexor 75 mg daily.  She has noticed her hair does not seem to be falling out as much since adding the iron supplementation.  She still has decreased energy despite getting 8 hours of sleep nightly.  She is snoring so severely her husband is unable to sleep in the same bedroom as her.  She does not report any noticed apneic spells.  She would like to go ahead and pursue a sleep study.   Prior note:  Pt presents for an OV with complaints of having little energy and hair thinning of approximately 6 months duration.  Associated symptoms include weight gain.  Patient wonders if symptoms could be caused from her thyroid.  She reports over the last 6 months she is had very little energy despite getting full  night sleep.  She does not feel she ever has energy, even when first awakening.  She endorses hair thinning.  She notices she is losing more hair when she is washing her hair.  Her hairdresser has also commented on her hair thinning.  She has had weight gain.  She states she has really cracked down on her diet and has been exercising and has not been able to lose weight.  She endorses snoring which started as she started to gain weight.  She reports she goes to bed routinely around 10 PM and wakes around 7 PM.  She has noted she feels like she may be more restless throughout the the night but feels she gets a sound sleep.  She has a history of a thyroid cyst that has been benign.  She is prescribed Zoloft through her gynecologist, that was started back last year for depression and anxiety.  She reports she does not feel like her anxiety is adequately controlled, although she admits it is improved on the Zoloft.   Depression screen [facility] 2/9 05/15/2018 08/21/2016  Decreased Interest 0 0  Down, Depressed, Hopeless 0 0  PHQ - 2 Score 0 0   GAD 7 : Generalized Anxiety Score 04/29/2019 02/10/2019  Nervous, Anxious, on Edge 1 2  Control/stop worrying 0 1  Worry too much - different things 0 2  Trouble relaxing 0 2  Restless 0 0  Easily annoyed or irritable 0 0  Afraid -  awful might happen 0 0  Total GAD 7 Score 1 -  Anxiety Difficulty Not difficult at all Somewhat difficult    No Known Allergies Social History   Social History Narrative   Married to Diane Cross, 1 child [name].   Chief Operating Officer, works full time in [employer].    Drinks caffeine. Takes a daily vitamin.    Wears seatbelt. Smoke detector in the home.    Firearms locked in the home.    Feels safe in her relationships.    Past Medical History:  Diagnosis Date  . Allergy   . Anxiety   . Migraines    Past Surgical History:  Procedure Laterality Date  . LIPOMA EXCISION     ? pt reports fatty tumor removal of neck.   Family History    Problem Relation Age of Onset  . Hypertension Mother   . Heart attack Father   . Autism Brother   . Diabetes Maternal Grandmother   . Diabetes Maternal Grandfather   . Colon cancer Neg Hx   . Liver cancer Neg Hx   . Esophageal cancer Neg Hx    Allergies as of 04/29/2019   No Known Allergies     Medication List       Accurate as of April 29, 2019  6:25 PM. If you have any questions, ask your nurse or doctor.        STOP taking these medications   sertraline 100 MG tablet Commonly known as: ZOLOFT Stopped by: Felix Pacini, DO     TAKE these medications   Azelastine-Fluticasone 137-50 MCG/ACT Susp 1 spray per each  nostril BID   EPINEPHrine 0.3 mg/0.3 mL Soaj injection Commonly known as: Auvi-Q Inject 0.3 mLs (0.3 mg total) into the muscle once as needed for up to 1 dose for anaphylaxis.   ipratropium 0.06 % nasal spray Commonly known as: Atrovent Place 2 sprays into both nostrils 4 (four) times daily.   levocetirizine 5 MG tablet Commonly known as: Xyzal Take 1 tablet (5 mg total) by mouth every evening. Needs office visit prior to any additional refills   montelukast 10 MG tablet Commonly known as: SINGULAIR Take 1 tablet (10 mg total) by mouth at bedtime. Needs office visit prior to any additional refills.   omeprazole 40 MG capsule Commonly known as: PRILOSEC Take 1 capsule (40 mg total) by mouth daily. Take 30-60 minutes before breakfast.   venlafaxine XR 75 MG 24 hr capsule Commonly known as: Effexor XR Take 1 capsule (75 mg total) by mouth daily with breakfast. What changed:   medication strength  See the new instructions. Changed by: Felix Pacini, DO       All past medical history, surgical history, allergies, family history, immunizations andmedications were updated in the EMR today and reviewed under the history and medication portions of their EMR.     ROS: Negative, with the exception of above mentioned in HPI   Objective:  Temp 97.6 F  (36.4 C) (Oral)   Ht 5\' 2"  (1.575 m)   Wt 177 lb (80.3 kg)   LMP 04/28/2019 (Exact Date)   BMI 32.37 kg/m  Body mass index is 32.37 kg/m. Gen: Afebrile. No acute distress.  HENT: AT. [place].  Eyes:Pupils Equal Round Reactive to light, Extraocular movements intact,  Conjunctiva without redness, discharge or icterus. Neuro: i. Alert. Oriented. Psych: Normal affect, dress and demeanor. Normal speech. Normal thought content and judgment.  No exam data present No results found. No results found for this  or any previous visit (from the past 24 hour(s)).  Assessment/Plan: NEYLA GAUNTT is a 34 y.o. female present for OV for  increased anxiety/low iron/vitamin D insufficiency -Doing well on Effexor 75 mg daily refills provided today. Follow-up 6 months.  Snoring/fatigue/daytime somnolence/obesity: -Discussed eval for sleep apnea.  And patient does desire referral today for further evaluation and sleep study. -She does snore-quite severely per spouse.  Encouraged weight loss can be helpful.  -Pulmonology referral placed.     Reviewed expectations re: course of current medical issues.  Discussed self-management of symptoms.  Outlined signs and symptoms indicating need for more acute intervention.  Patient verbalized understanding and all questions were answered.  Patient received an After-Visit Summary.   Orders Placed This Encounter  Procedures  . Ambulatory referral to Pulmonology   Meds ordered this encounter  Medications  . venlafaxine XR (EFFEXOR XR) 75 MG 24 hr capsule    Sig: Take 1 capsule (75 mg total) by mouth daily with breakfast.    Dispense:  90 capsule    Refill:  1     Note is dictated utilizing voice recognition software. Although note has been proof read prior to signing, occasional typographical errors still can be missed. If any questions arise, please do not hesitate to call for verification.   electronically signed by:  Howard Pouch, DO  [place]

## 2019-04-29 NOTE — Patient Instructions (Signed)

## 2019-05-07 NOTE — Progress Notes (Signed)
VIALS EXP 05-06-20 

## 2019-05-08 ENCOUNTER — Other Ambulatory Visit: Payer: Self-pay

## 2019-05-08 ENCOUNTER — Ambulatory Visit (INDEPENDENT_AMBULATORY_CARE_PROVIDER_SITE_OTHER): Payer: 59

## 2019-05-08 DIAGNOSIS — J309 Allergic rhinitis, unspecified: Secondary | ICD-10-CM

## 2019-05-15 ENCOUNTER — Encounter: Payer: Self-pay | Admitting: Allergy

## 2019-05-15 ENCOUNTER — Telehealth: Payer: Self-pay | Admitting: *Deleted

## 2019-05-15 ENCOUNTER — Ambulatory Visit (INDEPENDENT_AMBULATORY_CARE_PROVIDER_SITE_OTHER): Payer: 59 | Admitting: Allergy

## 2019-05-15 ENCOUNTER — Other Ambulatory Visit: Payer: Self-pay

## 2019-05-15 DIAGNOSIS — J329 Chronic sinusitis, unspecified: Secondary | ICD-10-CM | POA: Diagnosis not present

## 2019-05-15 DIAGNOSIS — J3089 Other allergic rhinitis: Secondary | ICD-10-CM | POA: Diagnosis not present

## 2019-05-15 DIAGNOSIS — H1010 Acute atopic conjunctivitis, unspecified eye: Secondary | ICD-10-CM | POA: Diagnosis not present

## 2019-05-15 DIAGNOSIS — J302 Other seasonal allergic rhinitis: Secondary | ICD-10-CM

## 2019-05-15 NOTE — Telephone Encounter (Signed)
Patient is wanting to discontinue her allergy injections at this time as confirmed with Dr. Selena Batten from today's tele-visit. Called patient and left a detailed voicemail per DPR permission advising that she will need to sign a discontinuation form for immunotherapy and that she could come in to sign the form or the form could be mailed to her home.

## 2019-05-15 NOTE — Progress Notes (Signed)
RE: Diane Cross MRN: [id number] DOB: 1985-11-27 Date of Telemedicine Visit: 05/15/2019  Referring provider: Natalia Leatherwood, DO Primary care provider: Natalia Leatherwood, DO  Chief Complaint: Follow-up   Telemedicine Follow Up Visit via Telephone: I connected with Diane Cross for a follow up on 05/15/19 by telephone and verified that I am speaking with the correct person using two identifiers.   I discussed the limitations, risks, security and privacy concerns of performing an evaluation and management service by telephone and the availability of in person appointments. I also discussed with the patient that there may be a patient responsible charge related to this service. The patient expressed understanding and agreed to proceed.  Patient is at home/work.  Provider is at the office.  Visit start time: 11:45AM Visit end time: 12:06PM Insurance consent/check in by: front desk Medical consent and medical assistant/nurse: Diane F.   History of Present Illness: She is a 34 y.o. female, who is being followed for allergic rhino conjunctivitis, frequent sinus infections. Her previous allergy office visit was on 05/28/2018 with Dr. Selena Batten. Today is a regular follow up visit.  Allergic rhino conjunctivitis Patient is now on her husband's insurance and she is on a high deductible plan.  Not sure if the allergy injections are helping but had no sinus infections.   Currently on Xyzal and Singulair daily and notices symptoms if misses a dose.  Only uses nasal sprays as needed.  Not using eye drops anymore.   Frequent sinus infections No more sinus infections since the last visit.  Assessment and Plan: Diane Cross is a 34 y.o. female with: Seasonal and perennial allergic rhinoconjunctivitis Past history - Perennial allergic rhinoconjunctivitis symptoms for the past 20 years but worse in the last few years.  Past testing positive to ragweed, some grass, weed, trees, dust mites and dog.  Interim  history - started AIT on 06/11/2018 [place] and DM-Dog). Had some localized reactions. Due to change in insurance, patient wants to stop injections for now.   Continue environmental control measures.  Stop allergy injections for now due to insurance coverage.  Let us know if want to restart.    Continue Singulair 10mg  daily.  Continue Xyzal 5mg  daily and may take twice a day if needed.  Continue Dymista 1 spray twice a day for congestion.   May use Atrovent nasal spray as needed for drainage.   Nasal saline spray (i.e., Simply Saline) or nasal saline lavage (i.e., NeilMed) is recommended as needed and prior to medicated nasal sprays.  Frequent sinus infections Interim history - no sinus infections the past year.   Keep track of infections.  Return in about 1 year (around 05/14/2020).  Diagnostics: None.  Medication List:  Current Outpatient Medications  Medication Sig Dispense Refill  . Azelastine-Fluticasone 137-50 MCG/ACT SUSP 1 spray per each  nostril BID 23 g 11  . EPINEPHrine (AUVI-Q) 0.3 mg/0.3 mL IJ SOAJ injection Inject 0.3 mLs (0.3 mg total) into the muscle once as needed for up to 1 dose for anaphylaxis. 2 Device 2  . ipratropium (ATROVENT) 0.06 % nasal spray Place 2 sprays into both nostrils 4 (four) times daily. 15 mL 5  . levocetirizine (XYZAL) 5 MG tablet Take 1 tablet (5 mg total) by mouth every evening. Needs office visit prior to any additional refills 90 tablet 0  . montelukast (SINGULAIR) 10 MG tablet Take 1 tablet (10 mg total) by mouth at bedtime. Needs office visit prior to any additional refills. 90 tablet 0  .  omeprazole (PRILOSEC) 40 MG capsule Take 1 capsule (40 mg total) by mouth daily. Take 30-60 minutes before breakfast. 30 capsule 3  . venlafaxine XR (EFFEXOR XR) 75 MG 24 hr capsule Take 1 capsule (75 mg total) by mouth daily with breakfast. 90 capsule 1   No current facility-administered medications for this visit.   Allergies: No  Known Allergies I reviewed her past medical history, social history, family history, and environmental history and no significant changes have been reported from her previous visit.  Review of Systems  Constitutional: Negative for appetite change, chills, fever and unexpected weight change.  HENT: Positive for congestion, postnasal drip and rhinorrhea.   Eyes: Negative for itching.  Respiratory: Negative for cough, chest tightness, shortness of breath and wheezing.   Cardiovascular: Negative for chest pain.  Gastrointestinal: Negative for abdominal pain.  Genitourinary: Negative for difficulty urinating.  Skin: Negative for rash.  Allergic/Immunologic: Positive for environmental allergies. Negative for food allergies.  Neurological: Negative for headaches.   Objective: Physical Exam   Not obtained as encounter was done via telephone.   Previous notes and tests were reviewed.  I discussed the assessment and treatment plan with the patient. The patient was provided an opportunity to ask questions and all were answered. The patient agreed with the plan and demonstrated an understanding of the instructions. After visit summary/patient instructions available via mychart.   The patient was advised to call back or seek an in-person evaluation if the symptoms worsen or if the condition fails to improve as anticipated.  I provided 21 minutes of non-face-to-face time during this encounter.  It was my pleasure to participate in [place] care today. Please feel free to contact me with any questions or concerns.   Sincerely,  Rexene Alberts, DO Allergy & Immunology  [facility] office: [phone] [facility] office: [place] office: [phone]

## 2019-05-15 NOTE — Patient Instructions (Addendum)
Other allergic rhinitis Past testing positive to ragweed, some grass, weed, trees, dust mites and dog.   Continue environmental control measures.  Stop allergy injections for now due to insurance coverage.  Let us know if want to restart.    Continue Singulair 10mg  daily.  Continue Xyzal 5mg  daily and may take twice a day if needed.  Continue Dymista 1 spray twice a day for congestion.   May use Atrovent nasal spray as needed for drainage.   Nasal saline spray (i.e., Simply Saline) or nasal saline lavage (i.e., NeilMed) is recommended as needed and prior to medicated nasal sprays.  Frequent sinus infections  Keep track of infections.  Follow up in 1 year or sooner if needed.

## 2019-05-15 NOTE — Assessment & Plan Note (Addendum)
Past history - Perennial allergic rhinoconjunctivitis symptoms for the past 20 years but worse in the last few years.  Past testing positive to ragweed, some grass, weed, trees, dust mites and dog.  Interim history - started AIT on 06/11/2018 [place] and DM-Dog). Had some localized reactions. Due to change in insurance, patient wants to stop injections for now.   Continue environmental control measures.  Stop allergy injections for now due to insurance coverage.  Let us know if want to restart.    Continue Singulair 10mg  daily.  Continue Xyzal 5mg  daily and may take twice a day if needed.  Continue Dymista 1 spray twice a day for congestion.   May use Atrovent nasal spray as needed for drainage.   Nasal saline spray (i.e., Simply Saline) or nasal saline lavage (i.e., NeilMed) is recommended as needed and prior to medicated nasal sprays.

## 2019-05-15 NOTE — Telephone Encounter (Signed)
Form has been mailed to patient's home per patient's request.

## 2019-05-15 NOTE — Assessment & Plan Note (Addendum)
Interim history - no sinus infections the past year.   Keep track of infections.

## 2019-05-20 ENCOUNTER — Encounter: Payer: Self-pay | Admitting: Family Medicine

## 2019-05-22 NOTE — Telephone Encounter (Signed)
Patient dropped off form on Wednesday. I received it today and faxed it to [place].

## 2019-05-24 ENCOUNTER — Other Ambulatory Visit: Payer: Self-pay | Admitting: Allergy

## 2019-06-05 ENCOUNTER — Institutional Professional Consult (permissible substitution): Payer: 59 | Admitting: Pulmonary Disease

## 2019-06-19 ENCOUNTER — Other Ambulatory Visit: Payer: Self-pay

## 2019-06-19 ENCOUNTER — Encounter: Payer: Self-pay | Admitting: Pulmonary Disease

## 2019-06-19 ENCOUNTER — Ambulatory Visit (INDEPENDENT_AMBULATORY_CARE_PROVIDER_SITE_OTHER): Payer: 59 | Admitting: Pulmonary Disease

## 2019-06-19 VITALS — BP 124/84 | HR 84 | Temp 97.4°F | Ht 61.0 in | Wt 182.2 lb

## 2019-06-19 DIAGNOSIS — R0683 Snoring: Secondary | ICD-10-CM

## 2019-06-19 DIAGNOSIS — G478 Other sleep disorders: Secondary | ICD-10-CM

## 2019-06-19 NOTE — Progress Notes (Signed)
Subjective:    Patient ID: Diane Cross, female    DOB: 03/03/1986, 34 y.o.   MRN: [id number]  Patient with a history of snoring, excessive daytime sleepiness  Usually goes to bed between 10 and 11 PM, takes her few minutes to fall asleep 2-3 awakenings Final awakening time between 7 and 7:30 AM Sleep is described as nonrestorative  Gained a lot of weight with pregnancy and was not able to lose all the weight so she is up at least about 30 pounds from prepregnancy weight  Denies any significant dryness She does have occasional headaches Admits to sweating at night  She admits to nocturia No gasping respirations at night  Dad snores heavily  Never smoker  Past Medical History:  Diagnosis Date  . Allergic rhinitis   . Allergy   . Anxiety   . Migraines    Social History   Socioeconomic History  . Marital status: Married    Spouse name: Rachel Bo  . Number of children: 1  . Years of education: 77  . Highest education level: Not on file  Occupational History  . Occupation: Banking  Tobacco Use  . Smoking status: Never Smoker  . Smokeless tobacco: Never Used  Substance and Sexual Activity  . Alcohol use: No  . Drug use: No  . Sexual activity: Yes    Partners: Male    Birth control/protection: Condom  Other Topics Concern  . Not on file  Social History Narrative   Married to [place], 1 child [name].   Buyer, retail, works full time in Science writer.    Drinks caffeine. Takes a daily vitamin.    Wears seatbelt. Smoke detector in the home.    Firearms locked in the home.    Feels safe in her relationships.    Social Determinants of Health   Financial Resource Strain:   . Difficulty of Paying Living Expenses: Not on file  Food Insecurity:   . Worried About Charity fundraiser in the Last Year: Not on file  . Ran Out of Food in the Last Year: Not on file  Transportation Needs:   . Lack of Transportation (Medical): Not on file  . Lack of Transportation (Non-Medical):  Not on file  Physical Activity:   . Days of Exercise per Week: Not on file  . Minutes of Exercise per Session: Not on file  Stress:   . Feeling of Stress : Not on file  Social Connections:   . Frequency of Communication with Friends and Family: Not on file  . Frequency of Social Gatherings with Friends and Family: Not on file  . Attends Religious Services: Not on file  . Active Member of Clubs or Organizations: Not on file  . Attends Archivist Meetings: Not on file  . Marital Status: Not on file  Intimate Partner Violence:   . Fear of Current or Ex-Partner: Not on file  . Emotionally Abused: Not on file  . Physically Abused: Not on file  . Sexually Abused: Not on file   Family History  Problem Relation Age of Onset  . Hypertension Mother   . Heart attack Father   . Autism Brother   . Diabetes Maternal Grandmother   . Diabetes Maternal Grandfather   . Colon cancer Neg Hx   . Liver cancer Neg Hx   . Esophageal cancer Neg Hx    Review of Systems  Constitutional: Negative for fever and unexpected weight change.  HENT: Positive for congestion and sneezing.  Negative for dental problem, ear pain, nosebleeds, postnasal drip, rhinorrhea, sinus pressure, sore throat and trouble swallowing.   Eyes: Negative for redness and itching.  Respiratory: Negative for cough, chest tightness, shortness of breath and wheezing.   Cardiovascular: Negative for palpitations and leg swelling.  Gastrointestinal: Negative for nausea and vomiting.  Genitourinary: Negative for dysuria.  Musculoskeletal: Negative for joint swelling.  Skin: Negative for rash.  Allergic/Immunologic: Positive for environmental allergies. Negative for food allergies and immunocompromised state.  Neurological: Negative for headaches.  Hematological: Does not bruise/bleed easily.  Psychiatric/Behavioral: Positive for dysphoric mood. The patient is nervous/anxious.       Objective:   Physical Exam Constitutional:       Appearance: She is obese.  HENT:     Head: Normocephalic.     Nose: Nose normal. No congestion.     Mouth/Throat:     Mouth: Mucous membranes are moist.     Comments: Retrognathia Macroglossia Mallampati 3, crowded oropharynx   Eyes:     Pupils: Pupils are equal, round, and reactive to light.  Cardiovascular:     Rate and Rhythm: Normal rate and regular rhythm.     Pulses: Normal pulses.     Heart sounds: No murmur. No friction rub.  Pulmonary:     Effort: Pulmonary effort is normal. No respiratory distress.     Breath sounds: Normal breath sounds. No stridor. No wheezing or rhonchi.  Musculoskeletal:        General: Normal range of motion.     Cervical back: Normal range of motion.  Skin:    General: Skin is warm.  Neurological:     General: No focal deficit present.     Mental Status: She is alert.    Vitals:   06/19/19 1441  BP: 124/84  Pulse: 84  Temp: (!) 97.4 F (36.3 C)  SpO2: 93%   Results of the Epworth flowsheet 06/19/2019  Sitting and reading 0  Watching TV 0  Sitting, inactive in a public place (e.g. a theatre or a meeting) 0  As a passenger in a car for an hour without a break 2  Lying down to rest in the afternoon when circumstances permit 3  Sitting and talking to someone 0  Sitting quietly after a lunch without alcohol 0  In a car, while stopped for a few minutes in traffic 0  Total score 5      Assessment & Plan:  .  Moderate probability of significant obstructive sleep apnea  Daytime sleepiness  Obesity  Pathophysiology of sleep disordered breathing discussed with the patient Treatment options for sleep disordered breathing discussed with the patient  Plan: We will schedule patient for home sleep study  Treatment options discussed  I will follow-up with the patient in about 3 months  Encouraged to call with any significant concerns Continue weight loss efforts

## 2019-06-19 NOTE — Addendum Note (Signed)
Addended by: Camila Li on: 06/19/2019 03:32 PM   Modules accepted: Orders

## 2019-06-19 NOTE — Patient Instructions (Signed)
Moderate probability of significant obstructive sleep apnea  We will schedule you for home sleep study Treatment options as discussed  We will follow-up with you in about 3 months   Sleep Apnea Sleep apnea is a condition in which breathing pauses or becomes shallow during sleep. Episodes of sleep apnea usually last 10 seconds or longer, and they may occur as many as 20 times an hour. Sleep apnea disrupts your sleep and keeps your body from getting the rest that it needs. This condition can increase your risk of certain health problems, including:  Heart attack.  Stroke.  Obesity.  Diabetes.  Heart failure.  Irregular heartbeat. What are the causes? There are three kinds of sleep apnea:  Obstructive sleep apnea. This kind is caused by a blocked or collapsed airway.  Central sleep apnea. This kind happens when the part of the brain that controls breathing does not send the correct signals to the muscles that control breathing.  Mixed sleep apnea. This is a combination of obstructive and central sleep apnea. The most common cause of this condition is a collapsed or blocked airway. An airway can collapse or become blocked if:  Your throat muscles are abnormally relaxed.  Your tongue and tonsils are larger than normal.  You are overweight.  Your airway is smaller than normal. What increases the risk? You are more likely to develop this condition if you:  Are overweight.  Smoke.  Have a smaller than normal airway.  Are elderly.  Are female.  Drink alcohol.  Take sedatives or tranquilizers.  Have a family history of sleep apnea. What are the signs or symptoms? Symptoms of this condition include:  Trouble staying asleep.  Daytime sleepiness and tiredness.  Irritability.  Loud snoring.  Morning headaches.  Trouble concentrating.  Forgetfulness.  Decreased interest in sex.  Unexplained sleepiness.  Mood swings.  Personality changes.  Feelings of  depression.  Waking up often during the night to urinate.  Dry mouth.  Sore throat. How is this diagnosed? This condition may be diagnosed with:  A medical history.  A physical exam.  A series of tests that are done while you are sleeping (sleep study). These tests are usually done in a sleep lab, but they may also be done at home. How is this treated? Treatment for this condition aims to restore normal breathing and to ease symptoms during sleep. It may involve managing health issues that can affect breathing, such as high blood pressure or obesity. Treatment may include:  Sleeping on your side.  Using a decongestant if you have nasal congestion.  Avoiding the use of depressants, including alcohol, sedatives, and narcotics.  Losing weight if you are overweight.  Making changes to your diet.  Quitting smoking.  Using a device to open your airway while you sleep, such as: ? An oral appliance. This is a custom-made mouthpiece that shifts your lower jaw forward. ? A continuous positive airway pressure (CPAP) device. This device blows air through a mask when you breathe out (exhale). ? A nasal expiratory positive airway pressure (EPAP) device. This device has valves that you put into each nostril. ? A bi-level positive airway pressure (BPAP) device. This device blows air through a mask when you breathe in (inhale) and breathe out (exhale).  Having surgery if other treatments do not work. During surgery, excess tissue is removed to create a wider airway. It is important to get treatment for sleep apnea. Without treatment, this condition can lead to:  High blood  pressure.  Coronary artery disease.  In men, an inability to achieve or maintain an erection (impotence).  Reduced thinking abilities. Follow these instructions at home: Lifestyle  Make any lifestyle changes that your health care provider recommends.  Eat a healthy, well-balanced diet.  Take steps to lose weight  if you are overweight.  Avoid using depressants, including alcohol, sedatives, and narcotics.  Do not use any products that contain nicotine or tobacco, such as cigarettes, e-cigarettes, and chewing tobacco. If you need help quitting, ask your health care provider. General instructions  Take over-the-counter and prescription medicines only as told by your health care provider.  If you were given a device to open your airway while you sleep, use it only as told by your health care provider.  If you are having surgery, make sure to tell your health care provider you have sleep apnea. You may need to bring your device with you.  Keep all follow-up visits as told by your health care provider. This is important. Contact a health care provider if:  The device that you received to open your airway during sleep is uncomfortable or does not seem to be working.  Your symptoms do not improve.  Your symptoms get worse. Get help right away if:  You develop: ? Chest pain. ? Shortness of breath. ? Discomfort in your back, arms, or stomach.  You have: ? Trouble speaking. ? Weakness on one side of your body. ? Drooping in your face. These symptoms may represent a serious problem that is an emergency. Do not wait to see if the symptoms will go away. Get medical help right away. Call your local emergency services (911 in the [place] Do not drive yourself to the hospital. Summary  Sleep apnea is a condition in which breathing pauses or becomes shallow during sleep.  The most common cause is a collapsed or blocked airway.  The goal of treatment is to restore normal breathing and to ease symptoms during sleep. This information is not intended to replace advice given to you by your health care provider. Make sure you discuss any questions you have with your health care provider. Document Revised: 09/25/2018 Document Reviewed: 12/04/2017 Elsevier Patient Education  [place].

## 2019-06-23 ENCOUNTER — Other Ambulatory Visit: Payer: Self-pay | Admitting: Allergy

## 2019-07-14 ENCOUNTER — Other Ambulatory Visit: Payer: Self-pay

## 2019-07-14 ENCOUNTER — Ambulatory Visit: Payer: 59

## 2019-07-14 DIAGNOSIS — G478 Other sleep disorders: Secondary | ICD-10-CM

## 2019-07-14 DIAGNOSIS — R0683 Snoring: Secondary | ICD-10-CM

## 2019-07-14 DIAGNOSIS — G4733 Obstructive sleep apnea (adult) (pediatric): Secondary | ICD-10-CM

## 2019-07-16 DIAGNOSIS — G4733 Obstructive sleep apnea (adult) (pediatric): Secondary | ICD-10-CM | POA: Diagnosis not present

## 2019-07-28 ENCOUNTER — Telehealth: Payer: Self-pay | Admitting: Pulmonary Disease

## 2019-07-28 ENCOUNTER — Other Ambulatory Visit: Payer: Self-pay | Admitting: *Deleted

## 2019-07-28 MED ORDER — OMEPRAZOLE 40 MG PO CPDR: 40.0000 mg | DELAYED_RELEASE_CAPSULE | Freq: Every day | ORAL | 0 refills | Status: AC

## 2019-07-28 NOTE — Telephone Encounter (Signed)
Dr. Val Eagle please advise on pt's home sleep study results.  Thanks!

## 2019-07-28 NOTE — Telephone Encounter (Signed)
Refilled Omeprazole. Patient needs office visit.

## 2019-07-28 NOTE — Telephone Encounter (Signed)
Called and left detailed msg letting her know will check with Dr Wynona Neat about sleep study results Please advise thanks!

## 2019-07-30 NOTE — Telephone Encounter (Signed)
AO, please advise on the sleep study results. Thanks!

## 2019-07-30 NOTE — Telephone Encounter (Signed)
All the studies I have were read by 1 PM yesterday

## 2019-08-01 ENCOUNTER — Encounter: Payer: Self-pay | Admitting: Primary Care

## 2019-08-01 ENCOUNTER — Ambulatory Visit (INDEPENDENT_AMBULATORY_CARE_PROVIDER_SITE_OTHER): Payer: 59 | Admitting: Primary Care

## 2019-08-01 DIAGNOSIS — G4733 Obstructive sleep apnea (adult) (pediatric): Secondary | ICD-10-CM | POA: Diagnosis not present

## 2019-08-01 NOTE — Telephone Encounter (Signed)
Located pt's HST results under procedures tab of pt's chart.  Dr. Wynona Neat has reviewed the home sleep test this showed mild obstructive sleep apnea.   Recommendations   Treatment with CPAP should be considered if there is significant daytime symptoms. An oral device for treatment of sleep disordered breathing may also be considered as an option of treatment. If CPAP is chosen as treatment, auto titration CPAP with pressure settings of 5-15 will be appropriate. Close clinical follow up with compliance monitoring to optimize therapeutic efficiency  Weight loss measures .   Advise against driving while sleepy & against medication with sedative side effects.   If she wants to go the CPAP route, needs to make appointment for 3 months for compliance with download with Dr. Wynona Neat.   Called and spoke with pt letting her know the results of HST and tx options recommended. Stated to pt that we could also schedule an OV to further discuss that and she stated she would like that. I have scheduled pt a televisit with Beth today at 11:30. Nothing further needed.

## 2019-08-01 NOTE — Progress Notes (Signed)
Virtual Visit via Telephone Note  I connected with Diane Cross on 08/01/19 at 11:30 AM EDT by telephone and verified that I am speaking with the correct person using two identifiers.  Location: Patient: Home Provider: Office   I discussed the limitations, risks, security and privacy concerns of performing an evaluation and management service by telephone and the availability of in person appointments. I also discussed with the patient that there may be a patient responsible charge related to this service. The patient expressed understanding and agreed to proceed.   History of Present Illness: 34 year old female. PMH significant for snoring and daytime fatigue. Patient of Dr. Wynona Neat, seen for initial consult on 06/19/19 for snoring/daytime fatigue. HST on 07/14/19 showed AHI 13.4/hr, SpO2 82%.   08/01/2019  Patient contacted today to review home sleep test results. Struggles during the day with fatigue. States that she feels tired all the time. She gained weight during her pregnancy, she has lost some of the weight but would still like to lose another 20-30 lbs. Reviewed home sleep test from March which showed mild obstructive sleep apnea results and treatment options. She would like to start on CPAP therapy.    Observations/Objective:  - Appears well. No shortness of breath, wheezing or cough  Assessment and Plan:  Mild OSA: - Reports daytime fatigue and nonrestorative sleep  - HST on 07/14/19, AH 13.4/hr - Start auto CPAP 5-15cm h20, mask of choice, supplies, heated humidified  - Encourage patient aim to wear CPAP every night for 4-6 hours or more, advised not to drive if experiencing excessive daytime fatigue or somnolence - Continue to encourage weight loss  Follow Up Instructions:  - 6 weeks with Dr. Wynona Neat or App  I discussed the assessment and treatment plan with the patient. The patient was provided an opportunity to ask questions and all were answered. The patient agreed  with the plan and demonstrated an understanding of the instructions.   The patient was advised to call back or seek an in-person evaluation if the symptoms worsen or if the condition fails to improve as anticipated.  I provided 15 minutes of non-face-to-face time during this encounter.   Glenford Bayley, NP

## 2019-08-01 NOTE — Patient Instructions (Addendum)
Home sleep test showed mild sleep - recommending start auto CPAP 5-15 cm h20 Aim to wear CPAP 4-6 hours every night Continue to work on weight loss   Orders: Start CPAP 5-15cm h20  Mask of choice, supplies, heated humidification, enroll in [place]  Follow-up: 6 weeks with Dr. Wynona Neat and Waynetta Sandy NP (OV or televisit)   Sleep Apnea Sleep apnea affects breathing during sleep. It causes breathing to stop for a short time or to become shallow. It can also increase the risk of:  Heart attack.  Stroke.  Being very overweight (obese).  Diabetes.  Heart failure.  Irregular heartbeat. The goal of treatment is to help you breathe normally again. What are the causes? There are three kinds of sleep apnea:  Obstructive sleep apnea. This is caused by a blocked or collapsed airway.  Central sleep apnea. This happens when the brain does not send the right signals to the muscles that control breathing.  Mixed sleep apnea. This is a combination of obstructive and central sleep apnea. The most common cause of this condition is a collapsed or blocked airway. This can happen if:  Your throat muscles are too relaxed.  Your tongue and tonsils are too large.  You are overweight.  Your airway is too small. What increases the risk?  Being overweight.  Smoking.  Having a small airway.  Being older.  Being female.  Drinking alcohol.  Taking medicines to calm yourself (sedatives or tranquilizers).  Having family members with the condition. What are the signs or symptoms?  Trouble staying asleep.  Being sleepy or tired during the day.  Getting angry a lot.  Loud snoring.  Headaches in the morning.  Not being able to focus your mind (concentrate).  Forgetting things.  Less interest in sex.  Mood swings.  Personality changes.  Feelings of sadness (depression).  Waking up a lot during the night to pee (urinate).  Dry mouth.  Sore throat. How is this  diagnosed?  Your medical history.  A physical exam.  A test that is done when you are sleeping (sleep study). The test is most often done in a sleep lab but may also be done at home. How is this treated?   Sleeping on your side.  Using a medicine to get rid of mucus in your nose (decongestant).  Avoiding the use of alcohol, medicines to help you relax, or certain pain medicines (narcotics).  Losing weight, if needed.  Changing your diet.  Not smoking.  Using a machine to open your airway while you sleep, such as: ? An oral appliance. This is a mouthpiece that shifts your lower jaw forward. ? A CPAP device. This device blows air through a mask when you breathe out (exhale). ? An EPAP device. This has valves that you put in each nostril. ? A BPAP device. This device blows air through a mask when you breathe in (inhale) and breathe out.  Having surgery if other treatments do not work. It is important to get treatment for sleep apnea. Without treatment, it can lead to:  High blood pressure.  Coronary artery disease.  In men, not being able to have an erection (impotence).  Reduced thinking ability. Follow these instructions at home: Lifestyle  Make changes that your doctor recommends.  Eat a healthy diet.  Lose weight if needed.  Avoid alcohol, medicines to help you relax, and some pain medicines.  Do not use any products that contain nicotine or tobacco, such as cigarettes, e-cigarettes, and chewing  tobacco. If you need help quitting, ask your doctor. General instructions  Take over-the-counter and prescription medicines only as told by your doctor.  If you were given a machine to use while you sleep, use it only as told by your doctor.  If you are having surgery, make sure to tell your doctor you have sleep apnea. You may need to bring your device with you.  Keep all follow-up visits as told by your doctor. This is important. Contact a doctor if:  The  machine that you were given to use during sleep bothers you or does not seem to be working.  You do not get better.  You get worse. Get help right away if:  Your chest hurts.  You have trouble breathing in enough air.  You have an uncomfortable feeling in your back, arms, or stomach.  You have trouble talking.  One side of your body feels weak.  A part of your face is hanging down. These symptoms may be an emergency. Do not wait to see if the symptoms will go away. Get medical help right away. Call your local emergency services (911 in the [place] Do not drive yourself to the hospital. Summary  This condition affects breathing during sleep.  The most common cause is a collapsed or blocked airway.  The goal of treatment is to help you breathe normally while you sleep. This information is not intended to replace advice given to you by your health care provider. Make sure you discuss any questions you have with your health care provider. Document Revised: 01/25/2018 Document Reviewed: 12/04/2017 Elsevier Patient Education  [place].

## 2019-08-13 ENCOUNTER — Telehealth: Payer: Self-pay | Admitting: Primary Care

## 2019-08-13 NOTE — Telephone Encounter (Signed)
Left message for patient to call back  

## 2019-08-13 NOTE — Telephone Encounter (Signed)
Can we send in order for CPAP mask? BW please advise.

## 2019-08-13 NOTE — Telephone Encounter (Signed)
Spoke with pt, advised her that Waynetta Sandy will sign a Rx for the mask. She is going to contact [email or web address] and have them request a Rx for the mask. Will wait for them to contact us. Nothing further is needed at this time.  Mask [employer] CPAP mask

## 2019-08-13 NOTE — Telephone Encounter (Signed)
Yes that is ok with me

## 2019-08-13 NOTE — Telephone Encounter (Signed)
Patient is returning phone call. Patient phone number is [phone].

## 2019-08-15 NOTE — Telephone Encounter (Signed)
Beth, Please advise on patients mychart message. I have also printed forms and they will be placed in your box to sign.

## 2019-08-15 NOTE — Telephone Encounter (Signed)
Yes i'm ok filling out.

## 2019-08-19 ENCOUNTER — Telehealth: Payer: Self-pay | Admitting: Primary Care

## 2019-08-19 NOTE — Telephone Encounter (Signed)
Spoke with patient in regards to the prescription. See MyChart encounter for more information.

## 2019-08-27 ENCOUNTER — Encounter: Payer: Self-pay | Admitting: Family Medicine

## 2019-08-29 MED ORDER — VENLAFAXINE HCL ER 150 MG PO CP24: 150.0000 mg | ORAL_CAPSULE | Freq: Every day | ORAL | 0 refills | Status: AC

## 2019-08-29 NOTE — Addendum Note (Signed)
Addended by: Felix Pacini A on: 08/29/2019 08:02 AM   Modules accepted: Orders

## 2019-10-31 ENCOUNTER — Other Ambulatory Visit: Payer: Self-pay | Admitting: Allergy

## 2020-01-22 ENCOUNTER — Other Ambulatory Visit: Payer: Self-pay | Admitting: Allergy

## 2020-04-08 ENCOUNTER — Other Ambulatory Visit: Payer: Self-pay | Admitting: Allergy

## 2020-04-08 ENCOUNTER — Telehealth: Payer: Self-pay

## 2020-04-08 ENCOUNTER — Telehealth: Payer: Self-pay | Admitting: Allergy

## 2020-04-08 MED ORDER — MONTELUKAST SODIUM 10 MG PO TABS: ORAL_TABLET | ORAL | 0 refills | Status: DC

## 2020-04-08 NOTE — Telephone Encounter (Signed)
Sent in montelukast ot is not due for visit until jan 2022

## 2020-04-08 NOTE — Telephone Encounter (Signed)
Sent in refill as pt is not due until jan 2022 and informed of this and and sh wants to talk to candice in billing about the cost of a visit gave her candices number

## 2020-04-08 NOTE — Telephone Encounter (Signed)
Patient called regarding a denial of refills on montelukast. Patient informed she was in need of an office visit. Patient states she has a high deductible plan and does not want to come and pay $200 just for medication refills. Patient would like to know if there is any alternative to get over the counter for seasonal allergies.  Please advise

## 2020-04-08 NOTE — Telephone Encounter (Signed)
Please see other telephone contact created today by Lyla Son.  Closing this one out.  Thanks

## 2020-05-19 NOTE — Progress Notes (Signed)
RE: Diane Cross MRN: [id number] DOB: [birth year] Date of Telemedicine Visit: 05/20/2020  Referring provider: Natalia Leatherwood, DO Primary care provider: Natalia Leatherwood, DO  Chief Complaint: Allergic Rhinitis  (Patient is needing refills for xyzal and montelukast. She stated that there has been in change since she was last seen.)   Telemedicine Follow Up Visit via Telephone: I connected with Diane Cross for a follow up on 05/20/20 by telephone and verified that I am speaking with the correct person using two identifiers.   I discussed the limitations, risks, security and privacy concerns of performing an evaluation and management service by telephone and the availability of in person appointments. I also discussed with the patient that there may be a patient responsible charge related to this service. The patient expressed understanding and agreed to proceed.  Patient is at home/work.  Provider is at the office.  Visit start time: 10:02AM Visit end time: 10:19AM Insurance consent/check in by: front desk Medical consent and medical assistant/nurse: Vonzell Schlatter.  History of Present Illness: She is a 35 y.o. female, who is being followed for allergic rhino conjunctivitis, frequent sinus infections. Her previous allergy office visit was on 05/15/2019 with Dr. Selena Batten via telemedicine. Today is a regular follow up visit.  Seasonal and perennial allergic rhinoconjunctivitis Currently taking Singulair 10mg  daily at night and Xyzal 5mg  daily at night with good benefit. Noticing symptoms if off medications for a few days.  No nasal spray or eye drops use.  Satisfied with current regimen. Insurance is still the same and not interested in re-starting injections at this time.  Frequent sinus infections No infections since the last year.   Assessment and Plan: Diane Cross is a 35 y.o. female with: Seasonal and perennial allergic rhinoconjunctivitis Past history - Perennial allergic rhinoconjunctivitis  symptoms for the past 20 years but worse in the last few years.  Past testing positive to ragweed, some grass, weed, trees, dust mites and dog. Started AIT on 06/11/2018 31 and DM-Dog) but stopped in January 2021 due to insurance change. Interim history -  Stable with Xyzal and Singulair daily.   Continue environmental control measures.  Continue Singulair 10mg  daily.  Continue Xyzal 5mg  daily and may take twice a day if needed.  If noticing worsening symptoms let [place] know.   Return if symptoms worsen or fail to improve.  Meds ordered this encounter  Medications  . montelukast (SINGULAIR) 10 MG tablet    Sig: Take 1 tablet (10 mg total) by mouth at bedtime.    Dispense:  90 tablet    Refill:  3  . levocetirizine (XYZAL) 5 MG tablet    Sig: Take 1 tablet (5 mg total) by mouth every evening.    Dispense:  90 tablet    Refill:  3   Diagnostics: None.  Medication List:  Current Outpatient Medications  Medication Sig Dispense Refill  . levocetirizine (XYZAL) 5 MG tablet Take 1 tablet (5 mg total) by mouth every evening. 90 tablet 3  . montelukast (SINGULAIR) 10 MG tablet Take 1 tablet (10 mg total) by mouth at bedtime. 90 tablet 3  . omeprazole (PRILOSEC) 40 MG capsule Take 1 capsule (40 mg total) by mouth daily. Take 30-60 minutes before breakfast. (Patient not taking: No sig reported) 30 capsule 0  . venlafaxine XR (EFFEXOR XR) 150 MG 24 hr capsule Take 1 capsule (150 mg total) by mouth daily with breakfast. Appt. Needed for refills. (Patient not taking: No sig reported) 30 capsule 0  No current facility-administered medications for this visit.   Allergies: No Known Allergies I reviewed her past medical history, social history, family history, and environmental history and no significant changes have been reported from her previous visit.  Review of Systems  Constitutional: Negative for appetite change, chills, fever and unexpected weight change.  HENT:  Negative for congestion, postnasal drip and rhinorrhea.   Eyes: Negative for itching.  Respiratory: Negative for cough, chest tightness, shortness of breath and wheezing.   Cardiovascular: Negative for chest pain.  Gastrointestinal: Negative for abdominal pain.  Genitourinary: Negative for difficulty urinating.  Skin: Negative for rash.  Allergic/Immunologic: Positive for environmental allergies. Negative for food allergies.  Neurological: Negative for headaches.   Objective: Physical Exam Not obtained as encounter was done via telephone.   Previous notes and tests were reviewed.  I discussed the assessment and treatment plan with the patient. The patient was provided an opportunity to ask questions and all were answered. The patient agreed with the plan and demonstrated an understanding of the instructions. After visit summary/patient instructions available via mychart.   The patient was advised to call back or seek an in-person evaluation if the symptoms worsen or if the condition fails to improve as anticipated.  I provided 17 minutes of non-face-to-face time during this encounter.  It was my pleasure to participate in Gibbs Bellomo's care today. Please feel free to contact me with any questions or concerns.   Sincerely,  Wyline Mood, DO Allergy & Immunology  [facility] office: [phone] [facility] office: [phone]

## 2020-05-20 ENCOUNTER — Ambulatory Visit (INDEPENDENT_AMBULATORY_CARE_PROVIDER_SITE_OTHER): Payer: 59 | Admitting: Allergy

## 2020-05-20 ENCOUNTER — Other Ambulatory Visit: Payer: Self-pay

## 2020-05-20 ENCOUNTER — Encounter: Payer: Self-pay | Admitting: Allergy

## 2020-05-20 DIAGNOSIS — J309 Allergic rhinitis, unspecified: Secondary | ICD-10-CM

## 2020-05-20 DIAGNOSIS — J329 Chronic sinusitis, unspecified: Secondary | ICD-10-CM

## 2020-05-20 DIAGNOSIS — H1010 Acute atopic conjunctivitis, unspecified eye: Secondary | ICD-10-CM

## 2020-05-20 DIAGNOSIS — J3089 Other allergic rhinitis: Secondary | ICD-10-CM

## 2020-05-20 MED ORDER — MONTELUKAST SODIUM 10 MG PO TABS: 10.0000 mg | ORAL_TABLET | Freq: Every day | ORAL | 3 refills | Status: DC

## 2020-05-20 MED ORDER — LEVOCETIRIZINE DIHYDROCHLORIDE 5 MG PO TABS: 5.0000 mg | ORAL_TABLET | Freq: Every evening | ORAL | 3 refills | Status: AC

## 2020-05-20 NOTE — Assessment & Plan Note (Signed)
Past history - Perennial allergic rhinoconjunctivitis symptoms for the past 20 years but worse in the last few years.  Past testing positive to ragweed, some grass, weed, trees, dust mites and dog. Started AIT on 06/11/2018 [place] and [facility]) but stopped in January 2021 due to insurance change. Interim history -  Stable with Xyzal and Singulair daily.   Continue environmental control measures.  Continue Singulair 10mg  daily.  Continue Xyzal 5mg  daily and may take twice a day if needed.  If noticing worsening symptoms let know.

## 2020-05-20 NOTE — Patient Instructions (Addendum)
Other allergic rhinitis Past testing positive to ragweed, some grass, weed, trees, dust mites and dog.   Continue environmental control measures.  Continue Singulair 10mg  daily.  Continue Xyzal 5mg  daily and may take twice a day if needed.  If noticing worsening symptoms let know.   Frequent sinus infections  Keep track of infections.  Follow up as needed with me or PCP in 1 year to get refills.   Reducing Pollen Exposure . Pollen seasons: trees (spring), grass (summer) and ragweed/weeds (fall). 04-29-1983 Keep windows closed in your home and car to lower pollen exposure.  08-31[birth year]1980 air conditioning in the bedroom and throughout the house if possible.  . Avoid going out in dry windy days - especially early morning. . Pollen counts are highest between 5 - 10 AM and on dry, hot and windy days.  . Save outside activities for late afternoon or after a heavy rain, when pollen levels are lower.  . Avoid mowing of grass if you have grass pollen allergy. Marland Kitchen Be aware that pollen can also be transported indoors on people and pets.  . Dry your clothes in an automatic dryer rather than hanging them outside where they might collect pollen.  . Rinse hair and eyes before bedtime. Control of House Dust Mite Allergen . Dust mite allergens are a common trigger of allergy and asthma symptoms. While they can be found throughout the house, these microscopic creatures thrive in warm, humid environments such as bedding, upholstered furniture and carpeting. . Because so much time is spent in the bedroom, it is essential to reduce mite levels there.  . Encase pillows, mattresses, and box springs in special allergen-proof fabric covers or airtight, zippered plastic covers.  . Bedding should be washed weekly in hot water (130 F) and dried in a hot dryer. Allergen-proof covers are available for comforters and pillows that can't be regularly washed.  Lilian Kapur the allergy-proof covers every few months. Minimize clutter  in the bedroom. Keep pets out of the bedroom.  Marland Kitchen Keep humidity less than 50% by using a dehumidifier or air conditioning. You can buy a humidity measuring device called a hygrometer to monitor this.  . If possible, replace carpets with hardwood, linoleum, or washable area rugs. If that's not possible, vacuum frequently with a vacuum that has a HEPA filter. . Remove all upholstered furniture and non-washable window drapes from the bedroom. . Remove all non-washable stuffed toys from the bedroom.  Wash stuffed toys weekly. Pet Allergen Avoidance: . Contrary to popular opinion, there are no "hypoallergenic" breeds of dogs or cats. That is because people are not allergic to an animal's hair, but to an allergen found in the animal's saliva, dander (dead skin flakes) or urine. Pet allergy symptoms typically occur within minutes. For some people, symptoms can build up and become most severe 8 to 12 hours after contact with the animal. People with severe allergies can experience reactions in public places if dander has been transported on the pet owners' clothing. Reyes Ivan Keeping an animal outdoors is only a partial solution, since homes with pets in the yard still have higher concentrations of animal allergens. . Before getting a pet, ask your allergist to determine if you are allergic to animals. If your pet is already considered part of your family, try to minimize contact and keep the pet out of the bedroom and other rooms where you spend a great deal of time. . As with dust mites, vacuum carpets often or replace carpet  with a hardwood floor, tile or linoleum. . High-efficiency particulate air (HEPA) cleaners can reduce allergen levels over time. . While dander and saliva are the source of cat and dog allergens, urine is the source of allergens from rabbits, hamsters, mice and [place] pigs; so ask a non-allergic family member to clean the animal's cage. . If you have a pet allergy, talk to your allergist about the  potential for allergy immunotherapy (allergy shots). This strategy can often provide long-term relief.

## 2020-10-26 IMAGING — CR DG CHEST 2V
2 series · 2 of 2 positions shown · non-contrast
Comparison: None.

CLINICAL DATA: Cough with abnormal breath sounds

EXAM:
CHEST - 2 VIEW

[w chest pa]
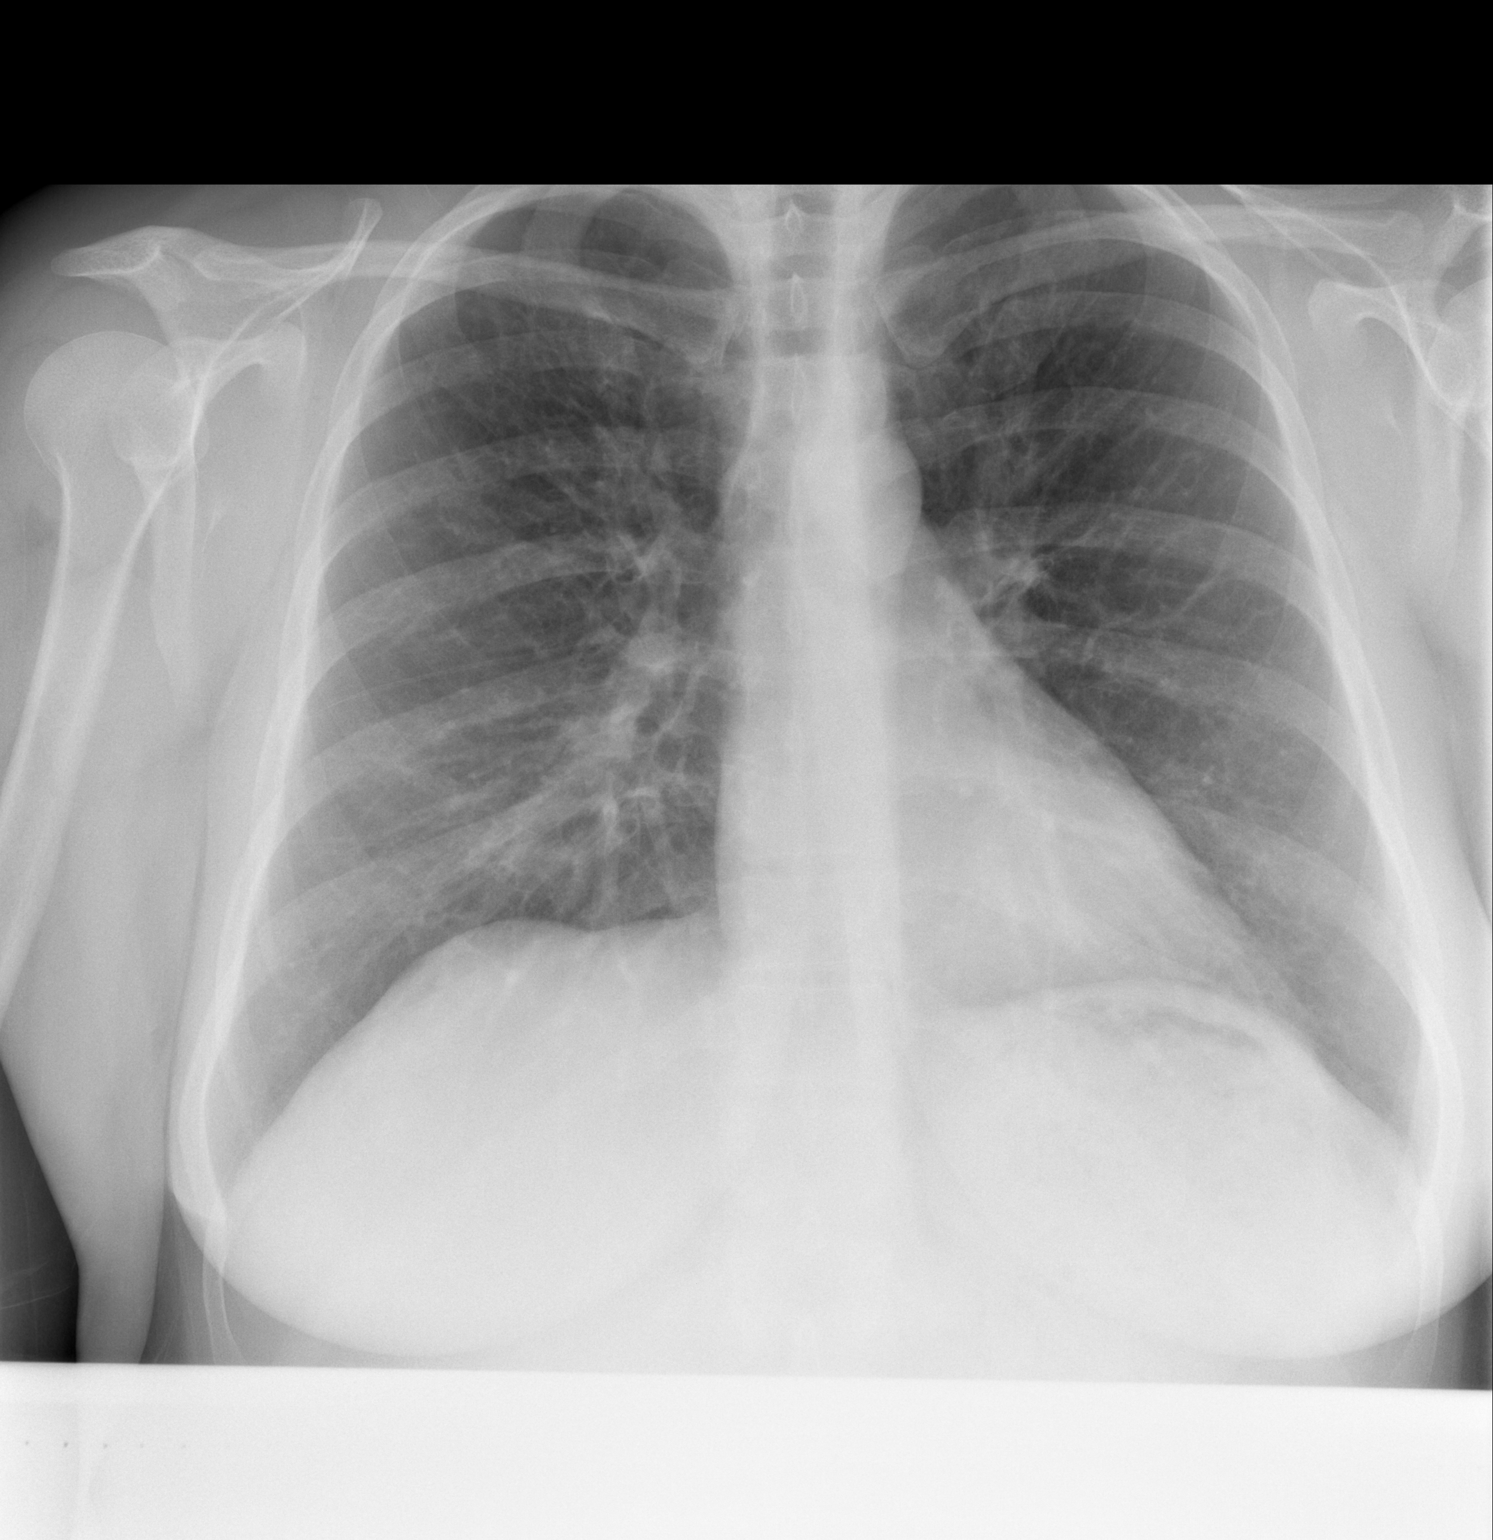

[w chest lat]
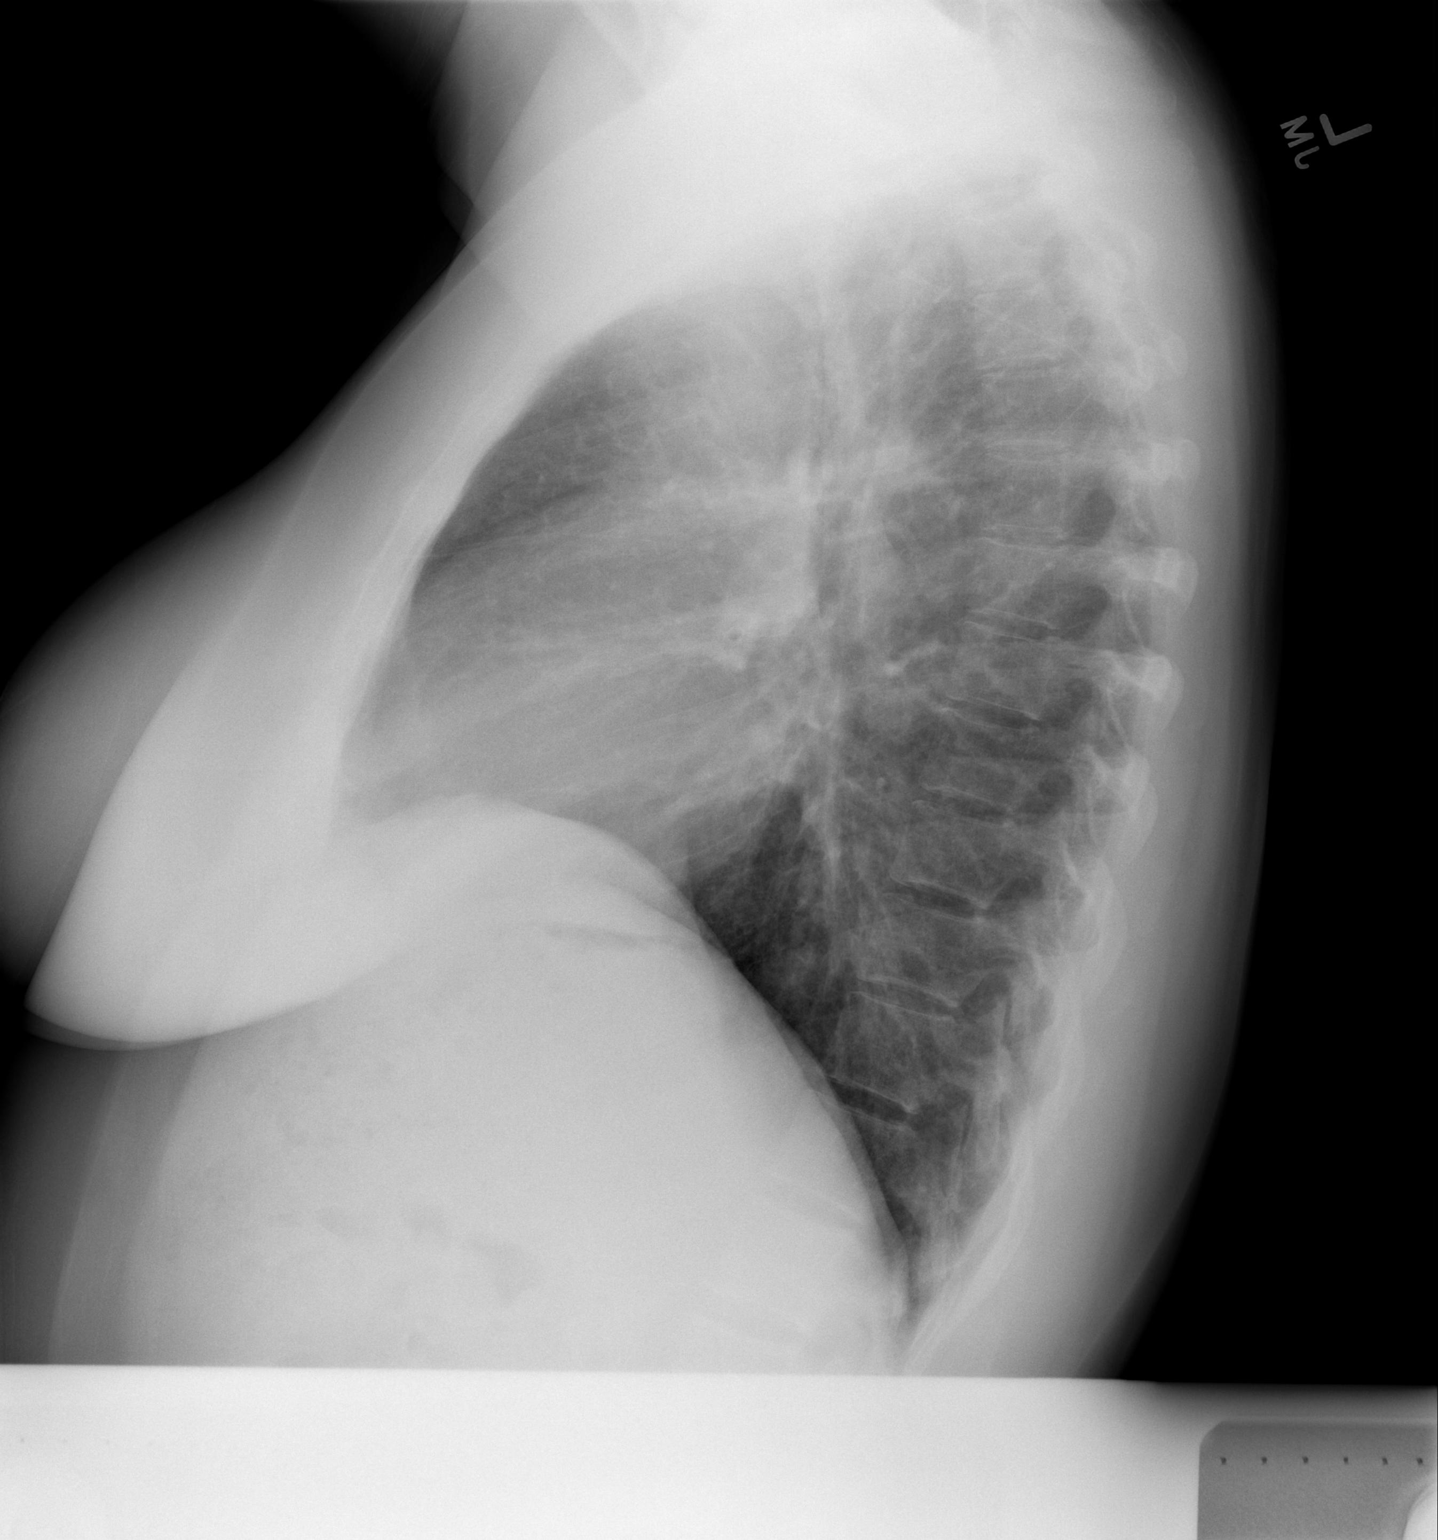

[2 of 2 positions shown; findings below may reference images not displayed]

FINDINGS: Lungs are clear. Heart size and pulmonary vascularity are normal. No
adenopathy. No bone lesions.
IMPRESSION: No edema or consolidation.

## 2021-04-10 ENCOUNTER — Other Ambulatory Visit: Payer: Self-pay | Admitting: Allergy

## 2021-04-11 NOTE — Telephone Encounter (Signed)
Courtesy refill only. No more refills until patient is seen.

## 2021-04-11 NOTE — Telephone Encounter (Signed)
Left patient a message that we did a courtesy refill for her montelukast 10 mg and to call the [facility] office to schedule office appointment,

## 2021-04-11 NOTE — Telephone Encounter (Signed)
A yearly supply was sent in on 05/20/20 - montelukast 90 tablets with 3 refills. I called the patient to see if she was out of her medication. I left a message for her to call the office back.

## 2021-10-19 NOTE — Telephone Encounter (Signed)
PCP removed.
